# Patient Record
Sex: Female | Born: 1945 | Race: White | Hispanic: No | Marital: Married | Smoking: Never smoker
Health system: Southern US, Community
[De-identification: ages and names within clinical notes are randomized; demographics above are authoritative.]

## PROBLEM LIST (undated history)

## (undated) DIAGNOSIS — D649 Anemia, unspecified: Secondary | ICD-10-CM

## (undated) DIAGNOSIS — R519 Headache, unspecified: Secondary | ICD-10-CM

## (undated) DIAGNOSIS — I10 Essential (primary) hypertension: Secondary | ICD-10-CM

## (undated) DIAGNOSIS — M1990 Unspecified osteoarthritis, unspecified site: Secondary | ICD-10-CM

## (undated) DIAGNOSIS — E785 Hyperlipidemia, unspecified: Secondary | ICD-10-CM

## (undated) DIAGNOSIS — R12 Heartburn: Secondary | ICD-10-CM

## (undated) DIAGNOSIS — Z973 Presence of spectacles and contact lenses: Secondary | ICD-10-CM

## (undated) DIAGNOSIS — K219 Gastro-esophageal reflux disease without esophagitis: Secondary | ICD-10-CM

## (undated) DIAGNOSIS — H547 Unspecified visual loss: Secondary | ICD-10-CM

## (undated) HISTORY — DX: Heartburn: R12

## (undated) HISTORY — DX: Essential (primary) hypertension: I10

## (undated) HISTORY — DX: Unspecified visual loss: H54.7

## (undated) HISTORY — DX: Anemia, unspecified: D64.9

## (undated) HISTORY — PX: HYSTERECTOMY: SHX81

## (undated) HISTORY — DX: Presence of spectacles and contact lenses: Z97.3

## (undated) HISTORY — DX: Headache, unspecified: R51.9

## (undated) HISTORY — PX: APPENDECTOMY (OPEN): SHX54

---

## 1989-11-01 ENCOUNTER — Ambulatory Visit: Admit: 1989-11-01 | Payer: Self-pay | Source: Ambulatory Visit

## 1991-02-17 ENCOUNTER — Ambulatory Visit: Admit: 1991-02-17 | Disposition: A | Discharge status: Home / Self Care | Payer: Self-pay | Source: Ambulatory Visit

## 1992-01-13 ENCOUNTER — Ambulatory Visit: Admit: 1992-01-13 | Disposition: A | Discharge status: Home / Self Care | Payer: Self-pay | Source: Ambulatory Visit

## 1993-01-26 ENCOUNTER — Ambulatory Visit: Admit: 1993-01-26 | Disposition: A | Discharge status: Home / Self Care | Payer: Self-pay | Source: Ambulatory Visit

## 1994-02-09 ENCOUNTER — Ambulatory Visit: Admit: 1994-02-09 | Disposition: A | Discharge status: Home / Self Care | Payer: Self-pay | Source: Ambulatory Visit

## 1994-02-22 ENCOUNTER — Ambulatory Visit: Admit: 1994-02-22 | Disposition: A | Discharge status: Home / Self Care | Payer: Self-pay | Source: Ambulatory Visit

## 1994-03-19 ENCOUNTER — Emergency Department: Admit: 1994-03-19 | Disposition: A | Discharge status: Home / Self Care | Payer: Self-pay | Source: Ambulatory Visit

## 1995-01-09 ENCOUNTER — Ambulatory Visit: Admit: 1995-01-09 | Disposition: A | Discharge status: Home / Self Care | Payer: Self-pay | Source: Ambulatory Visit

## 1995-04-15 ENCOUNTER — Emergency Department: Admit: 1995-04-15 | Disposition: A | Discharge status: Home / Self Care | Payer: Self-pay | Source: Ambulatory Visit

## 1995-04-16 ENCOUNTER — Emergency Department: Admit: 1995-04-16 | Disposition: A | Discharge status: Home / Self Care | Payer: Self-pay | Source: Ambulatory Visit

## 1996-02-12 ENCOUNTER — Ambulatory Visit: Admit: 1996-02-12 | Disposition: A | Discharge status: Home / Self Care | Payer: Self-pay | Source: Ambulatory Visit

## 1996-04-26 ENCOUNTER — Ambulatory Visit: Admit: 1996-04-26 | Disposition: A | Discharge status: Home / Self Care | Payer: Self-pay | Source: Ambulatory Visit

## 1997-04-16 ENCOUNTER — Ambulatory Visit: Admit: 1997-04-16 | Disposition: A | Discharge status: Home / Self Care | Payer: Self-pay | Source: Ambulatory Visit

## 1997-09-01 ENCOUNTER — Emergency Department
Admission: EM | Admit: 1997-09-01 | Disposition: A | Discharge status: Home / Self Care | Payer: Self-pay | Source: Ambulatory Visit

## 1999-02-13 ENCOUNTER — Ambulatory Visit
Admission: RE | Admit: 1999-02-13 | Disposition: A | Discharge status: Home / Self Care | Payer: Self-pay | Source: Ambulatory Visit

## 1999-11-28 ENCOUNTER — Ambulatory Visit
Admission: RE | Admit: 1999-11-28 | Disposition: A | Discharge status: Home / Self Care | Payer: Self-pay | Source: Ambulatory Visit

## 1999-12-15 ENCOUNTER — Ambulatory Visit: Admit: 1999-12-15 | Disposition: A | Discharge status: Home / Self Care | Payer: Self-pay | Source: Ambulatory Visit

## 2000-03-27 ENCOUNTER — Ambulatory Visit
Admission: RE | Admit: 2000-03-27 | Disposition: A | Discharge status: Home / Self Care | Payer: Self-pay | Source: Ambulatory Visit

## 2000-04-02 ENCOUNTER — Ambulatory Visit
Admission: RE | Admit: 2000-04-02 | Disposition: A | Discharge status: Home / Self Care | Payer: Self-pay | Source: Ambulatory Visit

## 2001-03-03 ENCOUNTER — Ambulatory Visit
Admission: RE | Admit: 2001-03-03 | Disposition: A | Discharge status: Home / Self Care | Payer: Self-pay | Source: Ambulatory Visit

## 2001-05-07 ENCOUNTER — Inpatient Hospital Stay
Admission: RE | Admit: 2001-05-07 | Disposition: A | Discharge status: Home / Self Care | Payer: Self-pay | Source: Ambulatory Visit

## 2001-05-24 ENCOUNTER — Emergency Department
Admission: EM | Admit: 2001-05-24 | Disposition: A | Discharge status: Home / Self Care | Payer: Self-pay | Source: Ambulatory Visit

## 2001-08-27 HISTORY — PX: OTHER SURGICAL HISTORY: SHX169

## 2002-08-22 ENCOUNTER — Emergency Department
Admission: EM | Admit: 2002-08-22 | Disposition: A | Discharge status: Home / Self Care | Payer: Self-pay | Source: Ambulatory Visit

## 2002-10-22 ENCOUNTER — Emergency Department
Admission: EM | Admit: 2002-10-22 | Disposition: A | Discharge status: Home / Self Care | Payer: Self-pay | Source: Ambulatory Visit

## 2004-02-04 ENCOUNTER — Ambulatory Visit
Admission: RE | Admit: 2004-02-04 | Disposition: A | Discharge status: Home / Self Care | Payer: Self-pay | Source: Ambulatory Visit

## 2004-03-09 ENCOUNTER — Ambulatory Visit
Admission: RE | Admit: 2004-03-09 | Disposition: A | Discharge status: Home / Self Care | Payer: Self-pay | Source: Ambulatory Visit

## 2004-03-27 ENCOUNTER — Emergency Department
Admission: EM | Admit: 2004-03-27 | Disposition: A | Discharge status: Home / Self Care | Payer: Self-pay | Source: Ambulatory Visit

## 2006-03-22 ENCOUNTER — Ambulatory Visit
Admission: RE | Admit: 2006-03-22 | Disposition: A | Discharge status: Home / Self Care | Payer: Self-pay | Source: Ambulatory Visit

## 2007-03-24 ENCOUNTER — Ambulatory Visit
Admission: RE | Admit: 2007-03-24 | Disposition: A | Discharge status: Home / Self Care | Payer: Self-pay | Source: Ambulatory Visit

## 2008-09-04 ENCOUNTER — Emergency Department
Admission: EM | Admit: 2008-09-04 | Disposition: A | Discharge status: Home / Self Care | Payer: Self-pay | Source: Ambulatory Visit

## 2009-03-30 ENCOUNTER — Ambulatory Visit
Admission: RE | Admit: 2009-03-30 | Disposition: A | Discharge status: Home / Self Care | Payer: Self-pay | Source: Ambulatory Visit

## 2009-05-25 ENCOUNTER — Ambulatory Visit
Admission: RE | Admit: 2009-05-25 | Disposition: A | Discharge status: Home / Self Care | Payer: Self-pay | Source: Ambulatory Visit | Admitting: Orthopaedic Surgery

## 2009-05-25 HISTORY — PX: RELEASE, DEQUERVAIN'S CONTRACTURE: SHX5075

## 2009-08-27 HISTORY — PX: CYST REMOVAL: SHX22

## 2010-03-19 DIAGNOSIS — Z4789 Encounter for other orthopedic aftercare: Secondary | ICD-10-CM | POA: Insufficient documentation

## 2010-08-27 DIAGNOSIS — G5601 Carpal tunnel syndrome, right upper limb: Secondary | ICD-10-CM

## 2010-08-27 HISTORY — DX: Carpal tunnel syndrome, right upper limb: G56.01

## 2010-09-11 ENCOUNTER — Ambulatory Visit
Admission: RE | Admit: 2010-09-11 | Disposition: A | Discharge status: Home / Self Care | Payer: Self-pay | Source: Ambulatory Visit

## 2010-09-13 ENCOUNTER — Ambulatory Visit (INDEPENDENT_AMBULATORY_CARE_PROVIDER_SITE_OTHER)
Admission: RE | Admit: 2010-09-13 | Disposition: A | Discharge status: Home / Self Care | Payer: Self-pay | Source: Ambulatory Visit

## 2010-09-13 ENCOUNTER — Ambulatory Visit
Admission: RE | Admit: 2010-09-13 | Disposition: A | Discharge status: Home / Self Care | Payer: Self-pay | Source: Ambulatory Visit

## 2011-03-07 ENCOUNTER — Ambulatory Visit
Admission: RE | Admit: 2011-03-07 | Disposition: A | Discharge status: Home / Self Care | Payer: Self-pay | Source: Ambulatory Visit | Admitting: Orthopaedic Surgery

## 2011-03-07 HISTORY — PX: CARPAL TUNNEL RELEASE: SHX101

## 2011-03-15 DIAGNOSIS — G5600 Carpal tunnel syndrome, unspecified upper limb: Secondary | ICD-10-CM | POA: Insufficient documentation

## 2011-08-12 ENCOUNTER — Ambulatory Visit (INDEPENDENT_AMBULATORY_CARE_PROVIDER_SITE_OTHER)
Admission: RE | Admit: 2011-08-12 | Disposition: A | Discharge status: Home / Self Care | Payer: Self-pay | Source: Ambulatory Visit

## 2011-09-07 ENCOUNTER — Emergency Department
Admission: EM | Admit: 2011-09-07 | Disposition: A | Discharge status: Home / Self Care | Payer: Self-pay | Source: Ambulatory Visit

## 2011-09-28 DIAGNOSIS — A4151 Sepsis due to Escherichia coli [E. coli]: Secondary | ICD-10-CM

## 2011-09-28 HISTORY — DX: Sepsis due to Escherichia coli (e. coli): A41.51

## 2011-10-15 ENCOUNTER — Inpatient Hospital Stay
Admission: EM | Admit: 2011-10-15 | Disposition: A | Discharge status: Home / Self Care | Payer: Self-pay | Source: Ambulatory Visit | Admitting: Family Medicine

## 2011-11-09 ENCOUNTER — Ambulatory Visit
Admission: RE | Admit: 2011-11-09 | Disposition: A | Discharge status: Home / Self Care | Payer: Self-pay | Source: Ambulatory Visit

## 2011-12-03 ENCOUNTER — Ambulatory Visit
Admission: RE | Admit: 2011-12-03 | Disposition: A | Discharge status: Home / Self Care | Payer: Self-pay | Source: Ambulatory Visit

## 2012-04-02 ENCOUNTER — Ambulatory Visit
Admission: RE | Admit: 2012-04-02 | Disposition: A | Discharge status: Home / Self Care | Payer: Self-pay | Source: Ambulatory Visit

## 2012-07-23 ENCOUNTER — Ambulatory Visit
Admission: RE | Admit: 2012-07-23 | Disposition: A | Discharge status: Home / Self Care | Payer: Self-pay | Source: Ambulatory Visit

## 2012-08-12 ENCOUNTER — Ambulatory Visit
Admission: RE | Admit: 2012-08-12 | Disposition: A | Discharge status: Home / Self Care | Payer: Self-pay | Source: Ambulatory Visit

## 2012-08-27 ENCOUNTER — Ambulatory Visit
Admission: RE | Admit: 2012-08-27 | Disposition: A | Discharge status: Home / Self Care | Payer: Self-pay | Source: Ambulatory Visit

## 2012-08-29 LAB — URINALYSIS, REFLEX TO MICROSCOPIC EXAM IF INDICATED
Bilirubin, UA: NEGATIVE mg/dL
Blood, UA: NEGATIVE mg/dL
Glucose, UA: NEGATIVE mg/dL
Ketones UA: NEGATIVE mg/dL
Nitrite, UA: NEGATIVE
Urine Protein: NEGATIVE mg/dL
Urine Specific Gravity: 1.002 (ref 1.001–1.040)
Urobilinogen, UA: 0.2 mg/dL
pH, Urine: 6.5 pH (ref 5.0–8.0)

## 2012-08-29 LAB — CBC AND DIFFERENTIAL
Basophils %: 0.9 % (ref 0.0–3.0)
Basophils Absolute: 0.1 10*3/uL (ref 0.0–0.3)
Eosinophils %: 1 % (ref 0.0–7.0)
Eosinophils Absolute: 0.1 10*3/uL (ref 0.0–0.8)
Hematocrit: 46.6 % (ref 36.0–48.0)
Hemoglobin: 15.7 gm/dL (ref 12.0–16.0)
Lymphocytes Absolute: 0.8 10*3/uL (ref 0.6–5.1)
Lymphocytes: 13 % — ABNORMAL LOW (ref 15.0–46.0)
MCH: 31 pg (ref 28–35)
MCHC: 34 gm/dL (ref 33–37)
MCV: 92 fL (ref 80–100)
MPV: 7.8 fL — ABNORMAL LOW (ref 8.0–12.0)
Monocytes Absolute: 0.7 10*3/uL (ref 0.1–1.7)
Monocytes: 11.1 % (ref 3.0–15.0)
Neutrophils %: 74 % (ref 42.0–78.0)
Neutrophils Absolute: 4.4 10*3/uL (ref 1.7–8.6)
PLT CT: 173 10*3/uL (ref 130–440)
RBC: 5.08 10*6/uL — ABNORMAL HIGH (ref 3.80–5.00)
RDW: 13.8 % (ref 12.0–15.0)
WBC: 5.9 10*3/uL (ref 4.0–11.0)

## 2012-08-29 LAB — COMPREHENSIVE METABOLIC PANEL
ALT: 19 U/L (ref 0–55)
AST (SGOT): 28 U/L (ref 10–42)
Albumin/Globulin Ratio: 1.15 Ratio (ref 0.70–1.50)
Albumin: 3.9 gm/dL (ref 3.5–5.0)
Alkaline Phosphatase: 67 U/L (ref 40–145)
Anion Gap: 14.8 Gap (ref 7.0–16.0)
BUN / Creatinine Ratio: 11.3 Ratio (ref 10.0–30.0)
BUN: 9 mg/dL (ref 7–22)
Bilirubin, Total: 0.5 mg/dL (ref 0.1–1.2)
CO2: 26 mMol/L (ref 20.0–30.0)
Calcium: 10 mg/dL (ref 8.5–10.5)
Chloride: 97 mMol/L — ABNORMAL LOW (ref 98–110)
Creatinine: 0.8 mg/dL (ref 0.60–1.20)
EGFR: >60 mL/min/{1.73_m2}
Globulin: 3.4 gm/dL (ref 2.0–4.0)
Glucose: 127 mg/dL — ABNORMAL HIGH (ref 70–99)
Osmolality Calc: 270 mOsm/kg — ABNORMAL LOW (ref 275–300)
Potassium: 2.8 mMol/L — CL (ref 3.5–5.3)
Protein, Total: 7.3 gm/dL (ref 6.0–8.3)
Sodium: 135 mMol/L — ABNORMAL LOW (ref 136–147)

## 2012-08-29 LAB — POTASSIUM: Potassium: 3.7 mMol/L (ref 3.5–5.3)

## 2012-09-17 LAB — CBC AND DIFFERENTIAL
Bands: 10 % (ref 0–10)
Bands: 12 % — ABNORMAL HIGH (ref 0–10)
Basophils %: 0.1 % (ref 0.0–3.0)
Basophils %: 0.2 % (ref 0.0–3.0)
Basophils %: 0.3 % (ref 0.0–3.0)
Basophils %: 0.3 % (ref 0.0–3.0)
Basophils %: 0.5 % (ref 0.0–3.0)
Basophils Absolute: 0 10*3/uL (ref 0.0–0.3)
Basophils Absolute: 0 10*3/uL (ref 0.0–0.3)
Basophils Absolute: 0 10*3/uL (ref 0.0–0.3)
Basophils Absolute: 0 10*3/uL (ref 0.0–0.3)
Basophils Absolute: 0 10*3/uL (ref 0.0–0.3)
Basophils Absolute: 0 10*3/uL (ref 0.0–0.3)
Basophils Absolute: 0 10*3/uL (ref 0.0–0.3)
Eosinophils %: 0 % (ref 0.0–7.0)
Eosinophils %: 0 % (ref 0.0–7.0)
Eosinophils %: 1 % (ref 0.0–7.0)
Eosinophils %: 3 % (ref 0.0–7.0)
Eosinophils %: 4.1 % (ref 0.0–7.0)
Eosinophils %: 4.3 % (ref 0.0–7.0)
Eosinophils Absolute: 0 10*3/uL (ref 0.0–0.8)
Eosinophils Absolute: 0 10*3/uL (ref 0.0–0.8)
Eosinophils Absolute: 0 10*3/uL (ref 0.0–0.8)
Eosinophils Absolute: 0.1 10*3/uL (ref 0.0–0.8)
Eosinophils Absolute: 0.2 10*3/uL (ref 0.0–0.8)
Eosinophils Absolute: 0.2 10*3/uL (ref 0.0–0.8)
Eosinophils Absolute: 0.3 10*3/uL (ref 0.0–0.8)
Hematocrit: 26.1 % — ABNORMAL LOW (ref 36.0–48.0)
Hematocrit: 26.6 % — ABNORMAL LOW (ref 36.0–48.0)
Hematocrit: 28.5 % — ABNORMAL LOW (ref 36.0–48.0)
Hematocrit: 29 % — ABNORMAL LOW (ref 36.0–48.0)
Hematocrit: 29.1 % — ABNORMAL LOW (ref 36.0–48.0)
Hematocrit: 29.2 % — ABNORMAL LOW (ref 36.0–48.0)
Hematocrit: 37.4 % (ref 36.0–48.0)
Hemoglobin: 12.8 gm/dL (ref 12.0–16.0)
Hemoglobin: 8.8 gm/dL — ABNORMAL LOW (ref 12.0–16.0)
Hemoglobin: 9 gm/dL — ABNORMAL LOW (ref 12.0–16.0)
Hemoglobin: 9.6 gm/dL — ABNORMAL LOW (ref 12.0–16.0)
Hemoglobin: 9.6 gm/dL — ABNORMAL LOW (ref 12.0–16.0)
Hemoglobin: 9.7 gm/dL — ABNORMAL LOW (ref 12.0–16.0)
Hemoglobin: 9.7 gm/dL — ABNORMAL LOW (ref 12.0–16.0)
Lymphocytes Absolute: 0.4 10*3/uL — ABNORMAL LOW (ref 0.6–5.1)
Lymphocytes Absolute: 0.6 10*3/uL (ref 0.6–5.1)
Lymphocytes Absolute: 0.7 10*3/uL (ref 0.6–5.1)
Lymphocytes Absolute: 0.7 10*3/uL (ref 0.6–5.1)
Lymphocytes Absolute: 0.8 10*3/uL (ref 0.6–5.1)
Lymphocytes Absolute: 0.9 10*3/uL (ref 0.6–5.1)
Lymphocytes Absolute: 0.9 10*3/uL (ref 0.6–5.1)
Lymphocytes: 10 % — ABNORMAL LOW (ref 15.0–46.0)
Lymphocytes: 10 % — ABNORMAL LOW (ref 15.0–46.0)
Lymphocytes: 13.9 % — ABNORMAL LOW (ref 15.0–46.0)
Lymphocytes: 15.8 % (ref 15.0–46.0)
Lymphocytes: 16.8 % (ref 15.0–46.0)
Lymphocytes: 4.7 % — ABNORMAL LOW (ref 15.0–46.0)
Lymphocytes: 4.8 % — ABNORMAL LOW (ref 15.0–46.0)
MCH: 31 pg (ref 28–35)
MCH: 31 pg (ref 28–35)
MCH: 32 pg (ref 28–35)
MCH: 32 pg (ref 28–35)
MCH: 32 pg (ref 28–35)
MCH: 32 pg (ref 28–35)
MCH: 32 pg (ref 28–35)
MCHC: 33 gm/dL (ref 33–37)
MCHC: 33 gm/dL (ref 33–37)
MCHC: 33 gm/dL (ref 33–37)
MCHC: 34 gm/dL (ref 33–37)
MCHC: 34 gm/dL (ref 33–37)
MCHC: 34 gm/dL (ref 33–37)
MCHC: 34 gm/dL (ref 33–37)
MCV: 93 fL (ref 80–100)
MCV: 94 fL (ref 80–100)
MCV: 94 fL (ref 80–100)
MCV: 95 fL (ref 80–100)
MCV: 95 fL (ref 80–100)
MCV: 95 fL (ref 80–100)
MCV: 95 fL (ref 80–100)
MPV: 7.1 fL — ABNORMAL LOW (ref 8.0–12.0)
MPV: 7.3 fL — ABNORMAL LOW (ref 8.0–12.0)
MPV: 7.6 fL — ABNORMAL LOW (ref 8.0–12.0)
MPV: 7.7 fL — ABNORMAL LOW (ref 8.0–12.0)
MPV: 8.2 fL (ref 8.0–12.0)
MPV: 8.2 fL (ref 8.0–12.0)
MPV: 8.3 fL (ref 8.0–12.0)
Monocytes Absolute: 0.5 10*3/uL (ref 0.1–1.7)
Monocytes Absolute: 0.6 10*3/uL (ref 0.1–1.7)
Monocytes Absolute: 0.6 10*3/uL (ref 0.1–1.7)
Monocytes Absolute: 0.6 10*3/uL (ref 0.1–1.7)
Monocytes Absolute: 0.6 10*3/uL (ref 0.1–1.7)
Monocytes Absolute: 0.6 10*3/uL (ref 0.1–1.7)
Monocytes Absolute: 1 10*3/uL (ref 0.1–1.7)
Monocytes: 10.4 % (ref 3.0–15.0)
Monocytes: 11.8 % (ref 3.0–15.0)
Monocytes: 7 % (ref 3.0–15.0)
Monocytes: 7.8 % (ref 3.0–15.0)
Monocytes: 7.9 % (ref 3.0–15.0)
Monocytes: 8 % (ref 3.0–15.0)
Monocytes: 9.9 % (ref 3.0–15.0)
Neutrophils %: 66.8 % (ref 42.0–78.0)
Neutrophils %: 69.7 % (ref 42.0–78.0)
Neutrophils %: 70 % (ref 42.0–78.0)
Neutrophils %: 72 % (ref 42.0–78.0)
Neutrophils %: 72.4 % (ref 42.0–78.0)
Neutrophils %: 87.2 % — ABNORMAL HIGH (ref 42.0–78.0)
Neutrophils %: 87.3 % — ABNORMAL HIGH (ref 42.0–78.0)
Neutrophils Absolute: 11.5 10*3/uL — ABNORMAL HIGH (ref 1.7–8.6)
Neutrophils Absolute: 3.4 10*3/uL (ref 1.7–8.6)
Neutrophils Absolute: 4.1 10*3/uL (ref 1.7–8.6)
Neutrophils Absolute: 4.3 10*3/uL (ref 1.7–8.6)
Neutrophils Absolute: 6 10*3/uL (ref 1.7–8.6)
Neutrophils Absolute: 6.1 10*3/uL (ref 1.7–8.6)
Neutrophils Absolute: 7.2 10*3/uL (ref 1.7–8.6)
PLT CT: 102 10*3/uL — ABNORMAL LOW (ref 130–440)
PLT CT: 113 10*3/uL — ABNORMAL LOW (ref 130–440)
PLT CT: 124 10*3/uL — ABNORMAL LOW (ref 130–440)
PLT CT: 143 10*3/uL (ref 130–440)
PLT CT: 154 10*3/uL (ref 130–440)
PLT CT: 207 10*3/uL (ref 130–440)
PLT CT: 250 10*3/uL (ref 130–440)
RBC: 2.79 10*6/uL — ABNORMAL LOW (ref 3.80–5.00)
RBC: 2.83 10*6/uL — ABNORMAL LOW (ref 3.80–5.00)
RBC: 3.01 10*6/uL — ABNORMAL LOW (ref 3.80–5.00)
RBC: 3.06 10*6/uL — ABNORMAL LOW (ref 3.80–5.00)
RBC: 3.08 10*6/uL — ABNORMAL LOW (ref 3.80–5.00)
RBC: 3.09 10*6/uL — ABNORMAL LOW (ref 3.80–5.00)
RBC: 4.02 10*6/uL (ref 3.80–5.00)
RDW: 14.1 % (ref 12.0–15.0)
RDW: 14.5 % (ref 12.0–15.0)
RDW: 14.9 % (ref 12.0–15.0)
RDW: 15.6 % — ABNORMAL HIGH (ref 12.0–15.0)
RDW: 15.8 % — ABNORMAL HIGH (ref 12.0–15.0)
RDW: 15.9 % — ABNORMAL HIGH (ref 12.0–15.0)
RDW: 16.2 % — ABNORMAL HIGH (ref 12.0–15.0)
WBC: 13.2 10*3/uL — ABNORMAL HIGH (ref 4.0–11.0)
WBC: 5.1 10*3/uL (ref 4.0–11.0)
WBC: 5.9 10*3/uL (ref 4.0–11.0)
WBC: 6 10*3/uL (ref 4.0–11.0)
WBC: 7.3 10*3/uL (ref 4.0–11.0)
WBC: 7.4 10*3/uL (ref 4.0–11.0)
WBC: 8.2 10*3/uL (ref 4.0–11.0)

## 2012-09-17 LAB — PHOSPHORUS
Phosphorus: 0.8 mg/dL — CL (ref 2.3–4.7)
Phosphorus: 1.4 mg/dL — ABNORMAL LOW (ref 2.3–4.7)
Phosphorus: 1.5 mg/dL — ABNORMAL LOW (ref 2.3–4.7)
Phosphorus: 2.2 mg/dL — ABNORMAL LOW (ref 2.3–4.7)
Phosphorus: 2.4 mg/dL (ref 2.3–4.7)
Phosphorus: 2.7 mg/dL (ref 2.3–4.7)
Phosphorus: 2.8 mg/dL (ref 2.3–4.7)

## 2012-09-17 LAB — BASIC METABOLIC PANEL
Anion Gap: 10.3 Gap (ref 7.0–16.0)
Anion Gap: 11 Gap (ref 7.0–16.0)
Anion Gap: 11.9 Gap (ref 7.0–16.0)
Anion Gap: 12.2 Gap (ref 7.0–16.0)
Anion Gap: 9.2 Gap (ref 7.0–16.0)
BUN / Creatinine Ratio: 12.1 Ratio (ref 10.0–30.0)
BUN / Creatinine Ratio: 15.4 Ratio (ref 10.0–30.0)
BUN / Creatinine Ratio: 4.8 Ratio — ABNORMAL LOW (ref 10.0–30.0)
BUN / Creatinine Ratio: 6.5 Ratio — ABNORMAL LOW (ref 10.0–30.0)
BUN / Creatinine Ratio: 9.5 Ratio — ABNORMAL LOW (ref 10.0–30.0)
BUN: 10 mg/dL (ref 7–22)
BUN: 3 mg/dL — ABNORMAL LOW (ref 7–22)
BUN: 4 mg/dL — ABNORMAL LOW (ref 7–22)
BUN: 6 mg/dL — ABNORMAL LOW (ref 7–22)
BUN: 8 mg/dL (ref 7–22)
CO2: 18 mMol/L — ABNORMAL LOW (ref 20.0–30.0)
CO2: 19 mMol/L — ABNORMAL LOW (ref 20.0–30.0)
CO2: 21 mMol/L (ref 20.0–30.0)
CO2: 22 mMol/L (ref 20.0–30.0)
CO2: 27 mMol/L (ref 20.0–30.0)
Calcium: 7.5 mg/dL — ABNORMAL LOW (ref 8.5–10.5)
Calcium: 7.6 mg/dL — ABNORMAL LOW (ref 8.5–10.5)
Calcium: 8 mg/dL — ABNORMAL LOW (ref 8.5–10.5)
Calcium: 8.2 mg/dL — ABNORMAL LOW (ref 8.5–10.5)
Calcium: 8.8 mg/dL (ref 8.5–10.5)
Chloride: 108 mMol/L (ref 98–110)
Chloride: 111 mMol/L — ABNORMAL HIGH (ref 98–110)
Chloride: 111 mMol/L — ABNORMAL HIGH (ref 98–110)
Chloride: 114 mMol/L — ABNORMAL HIGH (ref 98–110)
Chloride: 115 mMol/L — ABNORMAL HIGH (ref 98–110)
Creatinine: 0.62 mg/dL (ref 0.60–1.20)
Creatinine: 0.62 mg/dL (ref 0.60–1.20)
Creatinine: 0.63 mg/dL (ref 0.60–1.20)
Creatinine: 0.65 mg/dL (ref 0.60–1.20)
Creatinine: 0.66 mg/dL (ref 0.60–1.20)
EGFR: >60 mL/min/{1.73_m2}
EGFR: >60 mL/min/{1.73_m2}
EGFR: >60 mL/min/{1.73_m2}
EGFR: >60 mL/min/{1.73_m2}
EGFR: >60 mL/min/{1.73_m2}
Glucose: 108 mg/dL — ABNORMAL HIGH (ref 70–99)
Glucose: 87 mg/dL (ref 70–99)
Glucose: 89 mg/dL (ref 70–99)
Glucose: 89 mg/dL (ref 70–99)
Glucose: 94 mg/dL (ref 70–99)
Osmolality Calc: 276 mOsm/kg (ref 275–300)
Osmolality Calc: 276 mOsm/kg (ref 275–300)
Osmolality Calc: 278 mOsm/kg (ref 275–300)
Osmolality Calc: 279 mOsm/kg (ref 275–300)
Osmolality Calc: 281 mOsm/kg (ref 275–300)
Potassium: 3 mMol/L — CL (ref 3.5–5.3)
Potassium: 3.2 mMol/L — ABNORMAL LOW (ref 3.5–5.3)
Potassium: 3.2 mMol/L — ABNORMAL LOW (ref 3.5–5.3)
Potassium: 3.3 mMol/L — ABNORMAL LOW (ref 3.5–5.3)
Potassium: 4.9 mMol/L (ref 3.5–5.3)
Sodium: 139 mMol/L (ref 136–147)
Sodium: 140 mMol/L (ref 136–147)
Sodium: 141 mMol/L (ref 136–147)
Sodium: 141 mMol/L (ref 136–147)
Sodium: 142 mMol/L (ref 136–147)

## 2012-09-17 LAB — MAGNESIUM
Magnesium: 1.4 mg/dL — ABNORMAL LOW (ref 1.6–2.6)
Magnesium: 1.5 mg/dL — ABNORMAL LOW (ref 1.6–2.6)
Magnesium: 1.5 mg/dL — ABNORMAL LOW (ref 1.6–2.6)
Magnesium: 1.5 mg/dL — ABNORMAL LOW (ref 1.6–2.6)
Magnesium: 1.6 mg/dL (ref 1.6–2.6)
Magnesium: 1.6 mg/dL (ref 1.6–2.6)
Magnesium: 2.1 mg/dL (ref 1.6–2.6)

## 2012-09-17 LAB — COMPREHENSIVE METABOLIC PANEL
ALT: 15 U/L (ref 0–55)
ALT: 22 U/L (ref 0–55)
AST (SGOT): 17 U/L (ref 10–42)
AST (SGOT): 26 U/L (ref 10–42)
Albumin/Globulin Ratio: 0.77 Ratio (ref 0.70–1.50)
Albumin/Globulin Ratio: 0.82 Ratio (ref 0.70–1.50)
Albumin: 2 gm/dL — ABNORMAL LOW (ref 3.5–5.0)
Albumin: 3.1 gm/dL — ABNORMAL LOW (ref 3.5–5.0)
Alkaline Phosphatase: 51 U/L (ref 40–145)
Alkaline Phosphatase: 80 U/L (ref 40–145)
Anion Gap: 10 Gap (ref 7.0–16.0)
Anion Gap: 16.8 Gap — ABNORMAL HIGH (ref 7.0–16.0)
BUN / Creatinine Ratio: 15.8 Ratio (ref 10.0–30.0)
BUN / Creatinine Ratio: 18.3 Ratio (ref 10.0–30.0)
BUN: 12 mg/dL (ref 7–22)
BUN: 17 mg/dL (ref 7–22)
Bilirubin, Total: 1.4 mg/dL — ABNORMAL HIGH (ref 0.1–1.2)
Bilirubin, Total: 2.3 mg/dL — ABNORMAL HIGH (ref 0.1–1.2)
CO2: 22 mMol/L (ref 20.0–30.0)
CO2: 24 mMol/L (ref 20.0–30.0)
Calcium: 7.7 mg/dL — ABNORMAL LOW (ref 8.5–10.5)
Calcium: 9.6 mg/dL (ref 8.5–10.5)
Chloride: 106 mMol/L (ref 98–110)
Chloride: 89 mMol/L — ABNORMAL LOW (ref 98–110)
Creatinine: 0.76 mg/dL (ref 0.60–1.20)
Creatinine: 0.93 mg/dL (ref 0.60–1.20)
EGFR: >60 mL/min/{1.73_m2}
EGFR: >60 mL/min/{1.73_m2}
Globulin: 2.6 gm/dL (ref 2.0–4.0)
Globulin: 3.8 gm/dL (ref 2.0–4.0)
Glucose: 135 mg/dL — ABNORMAL HIGH (ref 70–99)
Glucose: 165 mg/dL — ABNORMAL HIGH (ref 70–99)
Osmolality Calc: 259 mOsm/kg — ABNORMAL LOW (ref 275–300)
Osmolality Calc: 274 mOsm/kg — ABNORMAL LOW (ref 275–300)
Potassium: 2.8 mMol/L — CL (ref 3.5–5.3)
Potassium: 3 mMol/L — CL (ref 3.5–5.3)
Protein, Total: 4.6 gm/dL — ABNORMAL LOW (ref 6.0–8.3)
Protein, Total: 6.9 gm/dL (ref 6.0–8.3)
Sodium: 127 mMol/L — ABNORMAL LOW (ref 136–147)
Sodium: 135 mMol/L — ABNORMAL LOW (ref 136–147)

## 2012-09-17 LAB — VANCOMYCIN, TROUGH: Vancomycin Trough: 4.31 ug/mL — ABNORMAL LOW (ref 10.00–20.00)

## 2012-09-17 LAB — PT/INR
PT INR: 1 (ref 0.8–1.2)
PT INR: 1 (ref 0.8–1.2)
PT INR: 1 (ref 0.8–1.2)
PT INR: 1.1 (ref 0.8–1.2)
PT INR: 1.1 (ref 0.8–1.2)
PT INR: 1.1 (ref 0.8–1.2)
PT: 10.5 s (ref 9.4–12.5)
PT: 10.7 s (ref 9.4–12.5)
PT: 10.7 s (ref 9.4–12.5)
PT: 11.4 s (ref 9.4–12.5)
PT: 11.8 s (ref 9.4–12.5)
PT: 12 s (ref 9.4–12.5)

## 2012-09-17 LAB — URINALYSIS, REFLEX TO MICROSCOPIC EXAM IF INDICATED
Glucose, UA: NEGATIVE mg/dL
Nitrite, UA: NEGATIVE
Urine Specific Gravity: 1.015 (ref 1.001–1.040)
Urobilinogen, UA: 1 mg/dL — AB
pH, Urine: 6.5 pH (ref 5.0–8.0)

## 2012-09-17 LAB — APTT
aPTT: 35.2 s — ABNORMAL HIGH (ref 22.0–35.0)
aPTT: 41.5 s — ABNORMAL HIGH (ref 22.0–35.0)

## 2012-09-17 LAB — AMYLASE: Amylase: 29 U/L — ABNORMAL LOW (ref 30–135)

## 2012-09-17 LAB — LACTIC ACID, PLASMA
Lactic Acid: 0.81 mMol/L (ref 0.50–2.10)
Lactic Acid: 1.16 mMol/L (ref 0.50–2.10)
Lactic Acid: 1.35 mMol/L (ref 0.50–2.10)
Lactic Acid: 2.6 mMol/L (ref 0.50–2.10)
Lactic Acid: 2.99 mMol/L (ref 0.50–2.10)

## 2012-09-17 LAB — POTASSIUM: Potassium: 3.3 mMol/L — ABNORMAL LOW (ref 3.5–5.3)

## 2012-09-17 LAB — VH C. DIFFICILE TOXIN B GENE BY DNA AMPLIFICATION: Stool Clostridium difficile Toxin B Gene DNA Amplification: NEGATIVE

## 2012-09-17 LAB — CARDIAC PROF.WITH TROPONIN HLAB
CKMB Index: 1 % (ref 0.0–2.3)
Creatine Kinase (CK): 112 U/L (ref 30–189)
Creatinine Kinase MB (CKMB): 1.1 ng/mL (ref 0.1–6.0)
Troponin I: 0.01 ng/mL (ref 0.00–0.02)

## 2012-09-17 LAB — LIPASE: Lipase: 11 U/L (ref 8–78)

## 2012-09-27 ENCOUNTER — Ambulatory Visit
Admission: RE | Admit: 2012-09-27 | Disposition: A | Discharge status: Home / Self Care | Payer: Self-pay | Source: Ambulatory Visit

## 2012-10-13 ENCOUNTER — Ambulatory Visit
Admission: RE | Admit: 2012-10-13 | Disposition: A | Discharge status: Home / Self Care | Payer: Self-pay | Source: Ambulatory Visit

## 2012-10-24 DIAGNOSIS — M1710 Unilateral primary osteoarthritis, unspecified knee: Secondary | ICD-10-CM | POA: Insufficient documentation

## 2012-11-03 ENCOUNTER — Ambulatory Visit
Admission: RE | Admit: 2012-11-03 | Disposition: A | Discharge status: Home / Self Care | Payer: Self-pay | Source: Ambulatory Visit | Admitting: Orthopaedic Surgery

## 2012-11-03 HISTORY — PX: KNEE ARTHROSCOPY: SUR90

## 2012-11-03 LAB — COMPREHENSIVE METABOLIC PANEL
ALT: 24 U/L (ref 0–55)
AST (SGOT): 23 U/L (ref 10–42)
Albumin/Globulin Ratio: 1.27 Ratio (ref 0.70–1.50)
Albumin: 3.8 gm/dL (ref 3.5–5.0)
Alkaline Phosphatase: 77 U/L (ref 40–145)
Anion Gap: 15.3 mMol/L (ref 7.0–18.0)
BUN / Creatinine Ratio: 18.9 Ratio (ref 10.0–30.0)
BUN: 17 mg/dL (ref 7–22)
Bilirubin, Total: 0.5 mg/dL (ref 0.1–1.2)
CO2: 27 mMol/L (ref 20.0–30.0)
Calcium: 9.8 mg/dL (ref 8.5–10.5)
Chloride: 103 mMol/L (ref 98–110)
Creatinine: 0.9 mg/dL (ref 0.60–1.20)
EGFR: >60 mL/min/{1.73_m2}
Globulin: 3 gm/dL (ref 2.0–4.0)
Glucose: 140 mg/dL — ABNORMAL HIGH (ref 70–99)
Osmolality Calc: 287 mOsm/kg (ref 275–300)
Potassium: 3.3 mMol/L — ABNORMAL LOW (ref 3.5–5.3)
Protein, Total: 6.8 gm/dL (ref 6.0–8.3)
Sodium: 142 mMol/L (ref 136–147)

## 2012-11-03 LAB — CBC AND DIFFERENTIAL
Basophils %: 0.6 % (ref 0.0–3.0)
Basophils Absolute: 0 10*3/uL (ref 0.0–0.3)
Eosinophils %: 3.6 % (ref 0.0–7.0)
Eosinophils Absolute: 0.2 10*3/uL (ref 0.0–0.8)
Hematocrit: 41.4 % (ref 36.0–48.0)
Hemoglobin: 13.9 gm/dL (ref 12.0–16.0)
Lymphocytes Absolute: 1.5 10*3/uL (ref 0.6–5.1)
Lymphocytes: 25.9 % (ref 15.0–46.0)
MCH: 32 pg (ref 28–35)
MCHC: 34 gm/dL (ref 33–37)
MCV: 94 fL (ref 80–100)
MPV: 8.6 fL (ref 8.0–12.0)
Monocytes Absolute: 0.4 10*3/uL (ref 0.1–1.7)
Monocytes: 6.8 % (ref 3.0–15.0)
Neutrophils %: 63.1 % (ref 42.0–78.0)
Neutrophils Absolute: 3.6 10*3/uL (ref 1.7–8.6)
PLT CT: 227 10*3/uL (ref 130–440)
RBC: 4.4 10*6/uL (ref 3.80–5.00)
RDW: 13.4 % (ref 12.0–15.0)
WBC: 5.7 10*3/uL (ref 4.0–11.0)

## 2012-11-03 LAB — MAGNESIUM: Magnesium: 1.7 mg/dL (ref 1.6–2.6)

## 2012-11-03 LAB — APTT: aPTT: 26.8 s (ref 22.0–35.0)

## 2012-11-03 LAB — TROPONIN I: Troponin I: 0 ng/mL (ref 0.00–0.02)

## 2012-11-03 LAB — PT/INR
PT INR: 1 (ref 0.8–1.2)
PT: 10.7 s (ref 9.4–12.5)

## 2012-11-14 ENCOUNTER — Encounter (INDEPENDENT_AMBULATORY_CARE_PROVIDER_SITE_OTHER): Payer: Self-pay | Admitting: Orthopaedic Surgery

## 2012-11-14 DIAGNOSIS — G5601 Carpal tunnel syndrome, right upper limb: Secondary | ICD-10-CM | POA: Insufficient documentation

## 2012-11-14 LAB — BASIC METABOLIC PANEL
Anion Gap: 13.9 mMol/L (ref 7.0–18.0)
BUN / Creatinine Ratio: 16.5 Ratio (ref 10.0–30.0)
BUN: 15 mg/dL (ref 7–22)
CO2: 26 mMol/L (ref 20.0–30.0)
Calcium: 9.5 mg/dL (ref 8.5–10.5)
Chloride: 105 mMol/L (ref 98–110)
Creatinine: 0.91 mg/dL (ref 0.60–1.20)
EGFR: >60 mL/min/{1.73_m2}
Glucose: 94 mg/dL (ref 70–99)
Osmolality Calc: 282 mOsm/kg (ref 275–300)
Potassium: 3.9 mMol/L (ref 3.5–5.3)
Sodium: 141 mMol/L (ref 136–147)

## 2012-11-21 NOTE — Op Note (Addendum)
[facility]                           OPERATIVE/PROCEDURE REPORT              NAME: [name] [name] ANN                  ADMITTED:   11/03/2012       MR#:  [id number]                            ACCT#:      [id number]       DOB:  1946/04/29                             PT TYPE/ROOM: S/--IO              _________________________________________________________________       DATE OF PROCEDURE:       11/03/2012              INDICATIONS:                     PREOPERATIVE DIAGNOSIS:       Torn medial meniscus and degenerative joint disease, left knee.              POSTOPERATIVE DIAGNOSIS:       Torn medial meniscus and degenerative joint disease, left knee       plus torn lateral meniscus.              PROCEDURE:       Diagnostic and surgical arthroscopy with partial medial and       lateral meniscectomies with medial compartment chondroplasty.              SURGEON:       Alison Murray, MD.              ASSISTANT(S):                     ANESTHESIA:       General.              ESTIMATED BLOOD LOSS:       Minimal.              TOURNIQUET TIME:       28 minutes.              PATHOLOGY SPECIMENS:       No specimen was sent.              FINDINGS:                     DESCRIPTION OF PROCEDURE:       The patient was placed in the supine position on the operating       room table.  The left lower limb was prepped and draped in       sterile fashion.  A time-out was held to confirm the patient's       identity and the identity of the procedure.  The Veress needle       was inserted in the lateral suprapatellar pouch.  The       arthroscope was introduced through a lateral portal anteriorly.       It was introduced obliquely so the medial compartment could be  seen first.  There were significant degenerative changes here.       There was hypertrophic synovitis.  A separate puncture was made       on the medial side of the joint and the Stryker tool was       introduced here with the curved double  bite instrument.  We did       a partial synovectomy improving visibility dramatically and we              also did a small conservative chondroplasty at 1st over the       femoral condyle.  Then, we could see the meniscus the anterior       2/3 were intact and the posterior 3rd there was an obvious tear       with a long diagonal flap.  This was trimmed back using the same       tool, then the probe hook was used to probe the rest of the       meniscus to make sure there were no other unstable sections.  We       then did a more proper chondroplasty over the medial femoral       condyle making it as smooth as possible.  Arthritic changes here       were grade 3.  There were no complete bare areas seen and we       were careful to do this in a conservative way to not remove       articular cartilage that might still be useful.  The limb was       placed in the figure of four position, we could see the entire       lateral meniscus.  There was a small parrot-beak shaped tear in       the middle 3rd.  This was trimmed with the same technique that       we had used medially.  Arthritis was much more benign on this       side of the joint with a smooth femur and a fairly good tibia.       No intervention was done in the articular cartilage on this side       of the joint.  We could see the anterior cruciate ligament from       this position and it was intact.  The patellofemoral area was       examined next.  There was hypertrophic synovitis.  There were       degenerative changes in the patella mostly grade 2.  We did not       do a chondroplasty here.  A small amount of inflamed looking       synovium was removed but this was by no means a complete       synovectomy.  It was limited in scope.  The knee was lavaged       clear and the instruments withdrawn.  Steri tapes were used to       close the three punctures.  She was placed in a sterile dressing       with a gently stretched Ace compression wrap.  She was  brought       to recovery in good condition.                                            **  PRELIMINARY REPORT UNLESS SIGNED**                                SEE DOCUMENT IMAGING SYSTEM FOR FINAL REPORT                                                        ________________________________                                        Berniece Pap, MD                                                                                                                  ID:   [id number]                                                          DocType:   [id number]       \:   SR                                                                 /:   [id number]       DD:  11/03/2012 08:33:04                                                DT:  11/03/2012 10:44:20                                                JOB: [id number]                                                            CC:  Dianah Field, MD [phone])            [facility] [phone])                   >  Authenticated by Berniece Pap., MD On 11/17/2012 10:53:49 AM

## 2012-11-21 NOTE — H&P (Signed)
[facility]                             HISTORY AND PHYSICAL         NAME: [name], [name]                  ADMITTED:   11/03/2012       MR#:  [id number]                            ACCT#:      [id number]       DOB:  Dec 26, 1945                             PT TYPE/ROOM: S/--IO         _________________________________________________________________       MEDICAL DOCTOR:       Dianah Field, MD         CHIEF COMPLAINT:       Left knee pain.         HISTORY OF PRESENT ILLNESS:       This is a 67 year old white female who complains of medial and       posterior left knee pain after twisting it at work while mopping       a floor.  Her employer has a rule of about 2-hour reporting of       _injury_____ the worker come and this has resulted in some degree of       conflict since this will seems arbitrary and inappropriate.  The       event happened in November of 2013.  She has had physical       therapy, but has not responded.         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         MEDICATIONS:       Her current medicine list is,         1.  Inderal 160 mg p.o. daily in the morning and a           sustained release formula.       2.  Triamterene/hydrochlorothiazide 37.5/25 mg.       3.  Zocor 40 mg p.o. daily in the evening.       4.  Amitriptyline HCL 50 mg p.o. daily in the evening.         PAST SURGICAL HISTORY:       Includes a left de Quervain's release and a cystectomy in       September of 2010 and a right carpal tunnel release in July of       2012.  She was hospitalized once for sepsis relating to E. coli       in February of 2013.         REVIEW OF SYSTEMS:       Includes a tendency to frequent headaches.  She has been told       she is anemic.  She is prone to an acid stomach.         FAMILY HISTORY:       Indicates that her mother had a myocardial infarction and       diabetes in her father and one sibling also had  myocardial       infarction.          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  She is a nonsmoker.  She does       not consume alcohol.         PHYSICAL EXAMINATION:       GENERAL:  This is a 67 year old white female in no acute       distress.       HEAD, EYES, EARS, NOSE, and THROAT:  Within normal limits.         NECK and AXILLAE:  Free of masses.       LUNGS:  Clear to auscultation.       HEART:  Regular rhythm.  There were no gallops or murmurs.       ABDOMEN:  Nontender.  The bowel sounds are normal.       PERIPHERAL VASCULAR:  Reveals good pulses in all four limbs.       NEUROLOGIC:  Normal for sensory and motor function.       ORTHOPEDIC:  Reveals tenderness over the medial joint line.       There is a very mild effusion.  The range of motion is almost       full.  Internal rotational stress does exacerbate her medial       joint line pain and external rotational stress gives meaningful       relief.  The ligament stress tests do not reveal any       instabilities.  The MRI from 10/13/2012 does reveal a complex       tear of the medial meniscus and some mild degenerative joint       disease.         DIAGNOSIS:       The diagnosis pertaining to the left knee is torn medial       meniscus with mild degenerative joint disease.         PLAN:       The plan is for a diagnostic and surgical arthroscopy with       partial medial meniscectomy.  Discussion was held concerning the       pros, cons and risks of the procedure.  The final decision to       proceed with this intervention was made at the time of this       evaluation.                                  **PRELIMINARY REPORT UNLESS SIGNED**                          SEE DOCUMENT IMAGING SYSTEM FOR FINAL REPORT         I hereby certify this patient for hospitalization based upon       medical necessity as noted above.                                            ________________________________  Berniece Pap, MD               ID:   [id number]       DocType:   [id number]       \:   SR       /:   [id number]       DD:  10/23/2012 11:02:54       DT:  10/23/2012 12:15:00       JOB: [id number]       CC:  Paulo Fruit MD [phone])            [facility] [phone])         >  Authenticated and Edited by Berniece Pap., MD On 11/17/12 10:54:06 AM

## 2012-12-25 ENCOUNTER — Ambulatory Visit (INDEPENDENT_AMBULATORY_CARE_PROVIDER_SITE_OTHER): Payer: Self-pay | Admitting: Orthopaedic Surgery

## 2012-12-25 ENCOUNTER — Encounter (INDEPENDENT_AMBULATORY_CARE_PROVIDER_SITE_OTHER): Payer: Self-pay | Admitting: Orthopaedic Surgery

## 2012-12-25 VITALS — BP 118/87 | HR 85 | Ht 64.0 in | Wt 171.3 lb

## 2012-12-25 NOTE — Progress Notes (Signed)
Progress Note    Subjective:    [name] is a 67 y.o. female being seen for Knee Pain  hurts on stairs, and sometimes at night    HPI:  Reviewed HPI taken by staff.    Review of Systems  :  Reviewed HPI taken by staff.    Objective:    BP 118/87  Pulse 85  Ht 1.626 m (5\' 4" )  Wt 77.701 kg (171 lb 4.8 oz)  BMI 29.39 kg/m2    Physical Exam      rom 0-120  No effussion     Assessment:      1. S/P arthroscopy of left knee, for torn meniscus combined with DJD          Plan:    RTW 12-26-12, avoiding stairs for the first 6 weeks.      Follow up in: prn

## 2012-12-25 NOTE — Progress Notes (Signed)
HPI Comments: Patient presents status post left knee arthroscopy, done on 11/03/12.  Patient states she is much better with the pain level at a 2 on a scale of 1-10 but at night the pain level is at 5-6, it wakes her up at night.      Knee Pain       Review of Systems   Constitutional: Positive for activity change.        Increasing activity slowly status post left knee arthroscopy   HENT: Negative for hearing loss.    Eyes: Negative for visual disturbance.   Respiratory: Negative for shortness of breath.    Cardiovascular: Negative for chest pain, palpitations and leg swelling.   Gastrointestinal: Negative for abdominal pain and diarrhea.   Genitourinary: Negative for urgency.   Musculoskeletal: Positive for joint swelling and gait problem.        Gait problem due to left knee; legs swell especially if on them a lot   Neurological: Negative for seizures, syncope and headaches.   Psychiatric/Behavioral: Negative for dysphoric mood. The patient is not nervous/anxious.

## 2013-01-16 NOTE — H&P (Signed)
[facility]                             HISTORY AND PHYSICAL              NAME: [name] [name] ANN                  ADMITTED:   05/25/2009       MR#:  [id number]                            ACCT#:      [id number]       DOB:  08/11/1946                             PT TYPE/ROOM: S/--IO              _________________________________________________________________       CHIEF COMPLAINT:       Bilateral wrist pain.              HISTORY OF PRESENT ILLNESS:       This is a 67 year old white female, who complains of left wrist       pain over the radial styloid, but also right hand numbness.  She       states a vacuum struck her on the left radial styloid in July of       this year and has had trouble there ever since.  She has also       had some Naprosyn, but not made much progress.  The numbness in       the right hand has been chronic over several years, and it has       improved with physical therapy to a meaningful degree.              ALLERGIES:       Significant for an allergic response to CODEINE.  She reacts       with diffuse itching.              CURRENT MEDICATIONS:       1.  Inderal 80 milligrams p.o. b.i.d.       2.  Zocor 40 milligrams p.o. daily.       3.  Triamterene hydrochlorothiazide 37.5/25 milligrams p.o.           daily.       4.  A multivitamin.       5.  Probiotic acidophilus for digestion.       6.  Omega-3 fish oil 1000 milligrams p.o. daily.              PAST SURGICAL EXPERIENCES:       Include a hysterectomy and a bladder suspension in 2003.              REVIEW OF SYSTEMS:       Reveals a tendency to headache.  She also does have       hypertension.  She has no symptoms, however, relating to the       cardiac, respiratory, GI, or GU systems.              FAMILY HISTORY:       Indicates that both parents have had heart attacks.  SOCIAL HISTORY:       Negative for smoking.  She does not consume alcohol.  She lives       with her husband.               PHYSICAL EXAMINATION:       GENERAL:  This is a 67 year old white female in no acute       distress. HEAD, EYES, EARS, NOSE, AND THROAT:  Within normal       limits.       NECK AND AXILLAE:  Free of masses. LUNGS:  Clear to       auscultation. HEART:  Has a regular rhythm.  There are no       gallops or murmurs. LUNGS:  Clear to auscultation. ABDOMEN:       Nontender.  The bowel sounds are normal.  There are no palpable       masses.       PERIPHERAL VASCULAR:  Exam reveals normal pulses in all 4 limbs.       NEUROLOGIC:  Evaluation is normal for sensory and motor              function, including the right hand when she is at rest. Phalen's       sign is positive on the right, but Tinel's sign is negative.       Neck extension combined with rotation does exacerbate the right       hand numbness in the small, ring, and long fingers.  The       examination on the left side reveals severe tenderness over the       radial styloid.  Finkelstein's test is dramatically positive on       the left.              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              DIAGNOSES:       1.  Pertaining to her left wrist are de Quervain syndrome           and a small dorsal ganglion cyst palpable near the proximal           carpal row.       2.  Pertaining to the right wrist is mild carpal tunnel           syndrome exacerbated by cervical radiculopathy.              TREATMENT PLAN:       For today's purposes, does include a left de Quervain's release       with removal of the ganglion cyst over the left dorsal wrist.       This is planned for 05/25/2009.  Discussion was held concerning       the pro's and con's, as well as the risks of the procedure, and       the final decision to proceed with this planned intervention was       made at the time of this evaluation.                            **PRELIMINARY REPORT UNLESS SIGNED**        SEE DOCUMENT IMAGING SYSTEM FOR FINAL REPORT  ________________________________                              Berniece Pap, MD                                            ID:   [id number]       DocType:   [id number]       \:   CP       /:   [id number]       DD:  04/28/2009 17:33:06       DT:  04/28/2009 19:22:55       JOB: [id number]       CC:  Berneice Heinrich MD ([phone])       >  Authenticated by Berniece Pap., MD On 06/01/2009 07:23:27 AM

## 2013-01-16 NOTE — Op Note (Signed)
[facility]                           OPERATIVE/PROCEDURE REPORT              NAME: [name], [name]                  ADMITTED:   05/25/2009       MR#:  [id number]                            ACCT#:      [id number]       DOB:  06/01/1946                             PT TYPE/ROOM: S/--IO              _________________________________________________________________       POSTOPERATIVE DIAGNOSIS:       Left de Quervain syndrome, and ganglion cyst, left dorsal wrist.              POSTOPERATIVE DIAGNOSIS:       Left de Quervain syndrome, and ganglion cyst, left dorsal wrist.              PROCEDURE:       Left deQuervain's release with cystectomy, left dorsal wrist.              SURGEON:       Alison Murray, MD              ANESTHESIA:       Bier block.              ESTIMATED BLOOD LOSS:       Zero.              PROCEDURE IN DETAIL:       The patient was placed in the supine position on the operating       table.  After the induction of regional anesthesia, the left       upper limb was prepped and draped in sterile fashion.  A 1.5 cm       long transverse incision was made 1 cm proximal to the tip of       the radial styloid.  The skin and subcutaneous tissue was parted       bluntly in the longitudinal orientation.  The 1st dorsal       compartment was gently teased open slowly with a 15 blade, and       then this was extended proximally and distally.  A 2 compartment       1st dorsal compartment was identified and therefore, a separate       slit was made over the 2nd portion of the compartment exposing a       total of 3 tendons in 2 compartments.  The floor of the channel       was explored to make sure that it was the affirmed surface of       the radius.  The thumb range of motion was carried out to make       sure that the tendon excursion was unimpaired.  Once this was       completed, the very small sessile cyst was palpated over the       dorsal wrist.  A transverse incision  was made over here and the       retinaculum was gently teased apart, exposing the extensor       pollicis longus, and we also identified the extensor carpi       radialis longus tendon.  The cyst seemed to be a small sessile       area of hypertrophic synovium coming directly off the wrist       joint.  This was removed.  It was smaller than expected and no       further palpable mass could be identified beyond that.  Each of       the 2 wounds were closed with 2 vertical mattress sutures of 4-0       nylon.  Sterile dressings were applied with a gently stretched       Ace compression wrap.  The patient was brought to recovery in       good condition.                            **PRELIMINARY REPORT UNLESS SIGNED**       SEE DOCUMENT IMAGING SYSTEM FOR FINAL REPORT                                        ________________________________                              Berniece Pap, MD                                            ID:   [id number]       DocType:   [id number]       \:   KB       /:   [id number]       DD:  05/25/2009 08:47:37       DT:  05/25/2009 09:02:14       JOB: [id number]            DR. Liliane Shi [phone])            [facility] [phone])       >  Authenticated by Berniece Pap., MD On 05/25/2009 09:06:02 AM

## 2013-01-17 NOTE — Op Note (Signed)
[facility]                           OPERATIVE/PROCEDURE REPORT              NAME: [name], [name]                  ADMITTED:   03/07/2011       MR#:  [id number]                            ACCT#:      [id number]       DOB:  07-10-1946                             PT TYPE/ROOM: S/--IO              _________________________________________________________________       PREOPERATIVE DIAGNOSIS:       Right carpal tunnel syndrome.              POSTOPERATIVE DIAGNOSIS:       Right carpal tunnel syndrome.              PROCEDURE:       Right carpal tunnel release.              SURGEON:       Alison Murray, MD.              ANESTHESIA:       Bier block.              ESTIMATED BLOOD LOSS:       Zero.              PROCEDURE IN DETAIL:       The patient was placed in the supine position on the operating       room table.  Regional anesthesia was induced.  The right upper       limb was prepped and draped in sterile fashion.  A 3 cm long       incision was made in the proximal palmar wrist crease.  The skin       and subcutaneous tissue was parted.  Care was taken to allow the       subcutaneous dissection to deliberately dressed ulnarward.  The       palmar fascia was identified and it was divided well beyond the       confines of the skin incision into the palm and into the distal       forearm.  Then a thin fatty layer between this layer and the       transverse carpal ligament was opened and then the transverse       carpal ligament was teased open until the synovium of the carpal       canal was identified.  A curved hemostat was placed within the       carpal canal.  A 15 blade was engaged into the serrations in the       upside down position, so the sharp side was facing up.  This was       used to slit the transverse carpal ligament both proximally and       distally.  The open hemostat test was used well into the palm  and well into the distal forearm in order to make sure that  the       release was complete and thorough.  The median nerve was       visualized, but not otherwise handled.  The skin only was closed       with 4-0 nylon sutures alternating simple and mattress types.       Sterile dressings were applied with a volar splint and gently       stretched Ace compression wrap.  The patient was brought to       recovery in good condition.                            **PRELIMINARY REPORT UNLESS SIGNED**       SEE DOCUMENT IMAGING SYSTEM FOR FINAL REPORT                                 ________________________________                              Berniece Pap, MD                                                   ID:   [id number]       DocType:   [id number]       \:   SR       /:   [id number]       DD:  03/07/2011 08:12:19       DT:  03/07/2011 08:56:29       JOB: [id number]       CC:  [facility] [phone])       >  Authenticated by Berniece Pap., MD On 03/08/2011 07:38:56 AM

## 2013-01-17 NOTE — H&P (Signed)
[facility]                             HISTORY AND PHYSICAL              NAME: [name], [name]                  ADMITTED:   03/07/2011       MR#:  [id number]                            ACCT#:      [id number]       DOB:  1946/01/30                             PT TYPE/ROOM: S/--IO              _________________________________________________________________       CHIEF COMPLAINT:       Right hand numbness.              HISTORY OF THE PRESENT ILLNESS:       This is a 67 year old white female who complains of numbness in       the thumb, index, long, and ring fingers, but she does complain       of this symptom dorsal and also volar at the fingers.              PAST MEDICAL HISTORY:       Does include an adverse reaction to CODEINE.  She reacts with       itching all over her body so this may be a true allergy.              CURRENT MEDICATION LIST:       1.  Omeprazole 40 mg p.o. daily.       2.  Propranolol 80 mg p.o. b.i.d.       3.  Triamterene hydrochlorothiazide 37.5/25 mg p.o. daily.       4.  Simvastatin 40 mg p.o. daily.       5.  Amitriptyline 25 mg p.o. b.i.d.       6.  Calcium 600 mg p.o. b.i.d.       7.  Fish oil 1200 mg p.o. b.i.d.              SURGICAL HISTORY:       Includes appendectomy, hysterectomy, and left de Quervain       release, bladder suspension and bilateral cataract extractions.              REVIEW OF SYSTEMS:       Does not reveal any additional symptoms.  She does suffer with a       hyperacid stomach for which the omeprazole is directed,       hypertension for which the triamterene is directed and also the       propranolol, and hypercholesterolemia treated by the       simvastatin.              FAMILY HISTORY:       Does include myocardial infarctions in both parents and one       sibling, and although she does not have irritable bowel syndrome       herself, she has a sibling with Crohn's disease, a sibling  with       ulcerative colitis of course  plaqued by the corresponding       symptoms.              SOCIAL HISTORY:       Indicates that she works in a [employer].  She does       not smoke or use alcohol.  She lives with her husband.              PHYSICAL EXAMINATION:       GENERAL:  This is a 67 year old white female in no acute       distress.       HEAD, EYES, EARS, NOSE, and THROAT:  Within normal limits.       NECK and AXILLAE:  Free of masses.       LUNGS:  Clear to auscultation.       HEART:  Regular rhythm.  There are no gallops or murmurs.       ABDOMEN:  Nontender.  The bowel sounds are normal.              PERIPHERAL VASCULAR EXAM:  Reveals good pulses in all four       limbs.       NEUROLOGIC:  The sensory evaluation reveals a decrease in       sensitivity of the thumb, index, long, and ring.  Neck extension       combined with rotation to the right increases her numbness.       Tinel's sign is equivocal.  Phalen sign is positive on the       right.              DIAGNOSTIC STUDIES:       The EMG and nerve conduction velocity does confirm the presence       of carpal tunnel syndrome.  No sign of cervical radiculopathy is       detected.              DIAGNOSIS:       Carpal tunnel syndrome on the right.              PLAN:       The plan is for a carpal tunnel release on 02/14/2011.       Discussion was held concerning the pros, cons, and risks of the       procedure, and the final decision to proceed was made at the       time of this evaluation.                            **PRELIMINARY REPORT UNLESS SIGNED**       SEE DOCUMENT IMAGING SYSTEM FOR FINAL REPORT                                 ________________________________                              Berniece Pap, MD                                            ID:   [id number]  DocType:   [id number]       \Kem Boroughs       /:   [id number]       DD:  02/06/2011 18:08:36       DT:  02/07/2011 03:46:23       JOB: [id number]       CC:  [facility] [employer] ([id number])       >  Authenticated by  Berniece Pap., MD On 03/08/2011 07:39:01 AM

## 2013-01-18 NOTE — Discharge Summary (Signed)
[facility]                           DISCHARGE SUMMARY REPORT              NAME: Hannah, [name]                  ADMITTED:   10/15/2011       MR#:  [id number]                            DISCHARGED: 10/20/2011       DOB:  12-25-1945                           ACCT#:      [id number]                                                    PT TYPE/ROOM: I/--2S              _________________________________________________________________       ADMISSION DIAGNOSES:       1.  Severe sepsis/septic shock.       2.   Hypokalemia.       3.  Hypertension.       4.  Hyponatremia.       5.  Urinary tract infection.       6.  Syncope.              DISCHARGE DIAGNOSES:       1.  Septic shock, resolved.       2.  History of hypertension, stable on home medications.       3.  Hypokalemia, resolved.       4.  Hyponatremia, resolved.       5.  Urinary tract infection/pyelonephritis/right           hydronephrosis resolved, discharged on antibiotics.       6.  Syncope, resolved, most likely secondary to septic shock.       7.  History of gastroesophageal reflux disease, stable on           home medications.              HISTORY OF PRESENT ILLNESS:       This is a 67 year old, Caucasian female with a past medical       history of benign essential hypertension, migraine, GERD who       presented to the emergency department on October 15, 2011, with       complaint of weakness for several days as well as a fall the day       prior.  At the night prior to her admission, she reported       increased urine output with urinary frequency and a history of       UTIs in the past.  Upon admission in the emergency room, she met       SIRS criteria, and there was a concern for severe sepsis.  She       received a head CT, which was benign and her fall at home was       thought to be secondary to her developing sepsis.  She was       admitted on a sepsis protocol ______ the ICU at [facility].               HOSPITAL COURSE:       During course of hospitalization, the patient was found to be in       septic shocks shortly after her admission, she was given       extensive fluid resuscitation with normal saline.  She received       approximately 10 L.  She was also placed on Levophed briefly for       blood pressure stabilization and she was given IV antibiotics,       vancomycin and Zosyn.  An abdominal CT was obtained which showed       a right renal hydronephrosis and her septic shock was thought to       be secondary to urinary tract infection causing pyelonephritis       causing sepsis.  Her urine culture and blood culture were both       positive for pansensitive E. coli.  She was transitioned to       Keflex at time of discharge.  At time of discharge, her       leukocytosis, hypertension, hypokalemia, hypomagnesemia and       hypophosphatemia, had resolved.  She was discharged to home in a       stable condition with a plan for office followup.              LABORATORY DATA:              At discharge, white blood count 6, hemoglobin 9.7, hematocrit       29, platelets 250.  INR 1.  PT 10.5, sodium 142, potassium 4.9,       chloride 108, bicarb 27, calcium 8.8, glucose 89, creatinine       0.62, BUN of 30, phosphorus 2.7, magnesium 1.5, lactic acid       1.35.  Of note, at time of admission, lactic acid was 2.60.       Urinalysis on admission, positive for 3+ blood, 1+ leukocyte       esterase.  Negative nitrite, 1 urobilinogen, 20 to 40 RBCs, 3 to       4 white blood cells, moderate bacteria, 3+ protein, 1+ ketones,       1+ bilirubin.  Urine culture positive for E. coli, pan       sensitive.  Blood culture positive for E. coli, pan sensitive.              IMAGING/PROCEDURES:       ______ placed by Dr. Claudie Leach.  Chest x-ray, pleural       effusion secondary to fluid overload secondary to treatment of       septic shock.  CT abdomen pelvis without contrast, free fluid,       mild colitis not  excluded, perinephric stranding and fluid       greater on the right.  CT of head, no acute intracranial       findings.  Periventricular areas of hypoattenuation likely from       chronic small vessel ischemic changes.  Vascular calcifications       noted at the carotid siphons.              DISPOSITION:  To home with office followup.              DISCHARGE CONDITION:       Stable.              ACTIVITY:       As tolerated.              DIET:       Heart healthy.              DISCHARGE INSTRUCTIONS:       Please take all medications as prescribed.  Call MD for       persistent nausea, vomiting, diarrhea, oliguria, chest pain,       shortness of breath, bleeding, fever greater than 101, or any       other concerns.              MEDICATIONS AT TIME OF DISCHARGE:       1.  Keflex 500 mg 1 capsule four times daily x8 days,           total 14-day course.       2.  Tramadol 50 mg, 1-2 tabs four times daily p.r.n.       3.  Amitriptyline HCL 25 mg 1-2 tabs q.h.s.       4.  Omeprazole 40 mg 1 tab daily.       5.  Dyazide 3.75/25 mg 1 tab daily.       6.  Inderal LA 80 mg 1 cap daily.       7.  Zocor 40 mg 1 cap q.h.s.       8.  Relpax 20 mg one tab at headache onset for migraines.       9.  Diurex.       10.  CoQ 10.       11.  Antioxidant vitamins.                                            **PRELIMINARY REPORT UNLESS SIGNED**                                SEE DOCUMENT IMAGING SYSTEM FOR FINAL REPORT                                                        ________________________________                                               Lorenza Chick, MD (RES) For                                        Paulo Fruit, MD  ID:   [id number]                                                          DocType:   [id number]       \:   JR                                                                 /:   [id number]       DD:   10/22/2011 09:50:33                                                DT:  10/23/2011 00:55:03                                                JOB: [id number]                                                            CC:  [facility] [phone])            MICHAEL B VOORHEES ([id number])                   >                                                                  Authenticated by Paulo Fruit, MD On 11/20/2011 13:08:65 PM

## 2013-01-18 NOTE — Procedures (Signed)
[facility], [place]                               CARDIOVASCULAR LABORATORY           Two-dimensional echocardiogram.                            NAME: [name], [name]                              ROOM#: I/0003-A-IC              DOB:  [birth year]                                            AGE/SEX: 65/F       RU#:[id number]       [id number]       _________________________________________________________________________              TEST DATE: 10/16/2011                    REQ PHYS: Dianah Field, MD                                                                                              ORDER#: [id number]                            INT PHYS: Bing Quarry, MD       HGT: 63 inches                                                WEIGHT: 161       TECH: DE                                                        BP: 94/54       _________________________________________________________________________              DATE OF PROCEDURE:       10/16/2011              REQUESTING PHYSICIAN:       Dianah Field, MD              INT PHYSICIAN:       Bing Quarry, MD              HEIGHT:       63 inches              WEIGHT:  161              BLOOD PRESSURE:       94/54              TECHNICIAN:       DE              TYPE OF REPORT:       Two-dimensional echocardiogram.              TIME OF TEST:       11:36 AM              INDICATION:       Atrial tachycardia.              INTERPRETATION:       2-D and M-mode measurements were performed and reveal the following:              RVID (ED):  --   (2.1-3.2)  LVPW (ES):  1.2  (0.9-2.1)        IVS (ED):   0.90 (0.7-1.1)  AORTA:      3.1  (2.2-3.7)        LVID (ED):  3.6  (4.0-5.6)  LVOT:       --   (2.0-4.0)        LVPW (ED):  0.90 (0.7-1.1)  LA:         3.2  (2.5-4.0)        IVS (ES):   1.2  (0.8-2.0)  EF:         --   (55-77%)                LVID (ES):  2.3  (2.0-3.8)  EPSS:       --   (2-7 MM)                       DOPPLER/COLOR  FLOW:       MITRAL VALVE:  ACTUAL  NORMAL     AORTIC VALVE    ACTUAL  NORMAL           V MAX M/SEC:   --      (0.6-1.3)  V MAX M/SEC:    --      (1.0-1.7)        T 1/2 M/SEC:   --      (30-60)    LVOT VEL:       --      (0.7-1.1)        VALVE AREA:    --      (4-6 CM)   VALVE AREA:     --      (3-5 CM)         REGURG:        --                 REGURG:         --                       PISA:          --                 PRES 1/2 TIME:  --                       ERO:           --  AO MAX PG:      10.6                     RV:            --                 AO MEAN PG:     --                                     TRICUSPID VALVE  ACTUAL  NORMAL     PULMONIC VALVE  ACTUAL  NORMAL           V MAX M/SEC:     --      (0.3-0.7)  V MAX M/SEC:    --      (0.6-0.9)        REGURG:          --                 REGURG          --                       RV SYSTOLIC:     --      (15-30)                                             PRESSURE:        --                           Two-dimensional echo:  The study is of technically limited quality.  The       underlying rhythm appears to be atrial tachycardia, rate between 100 and       110.              1.  Aortic valve:  Mild sclerosis without stenosis.       2.  Mitral valve:  Anatomically normal without obvious thickening,           prolapse or vegetations.       3.  Tricuspid and pulmonic valves:  Not well seen.  No gross           abnormality.       4.  Chamber dimensions:  There is moderate right ventricular and           right atrial dilatation best appreciated in the apical four-chamber           view.       5.  Left ventricular systolic function is hyperdynamic with a           visually estimated ejection fraction in excess of 75%.  There are no           focal wall motion abnormalities.       6.  There is no pericardial effusion.       7.  No obvious intracardiac masses, thrombi, or vegetations seen.              DOPPLER COLOR FLOW IMAGING:              1.  Peak velocity  across the aortic valve is  normal.  There is no           significant aortic regurgitation.       2.  Left ventricular inflow pattern is normal.       3.  There is trace mitral and tricuspid insufficiency.  Estimated           right ventricular systolic pressure is 45.              CONCLUSION:       1.  Dilated right ventricle and right atrium with mildly elevated           right ventricular systolic pressure.       2.  Hyperdynamic left ventricular systolic function.                                            **PRELIMINARY REPORT UNLESS SIGNED**                                        SEE DOCUMENT IMAGING SYSTEM FOR FINAL REPORT                                                                        ________________________________                                                Bing Quarry, MD                                                                                                                                 ID:   [id number]                                                                  DocType:   [id number]       \:   VH                                                                         /:  251       DD:  10/16/2011 17:05:05                                                        DT:  10/16/2011 19:30:42                                                        JOB: [id number]                                                                    >                                                                          Authenticated by Bing Quarry., MD On 10/17/2011 09:49:11 AM

## 2013-01-18 NOTE — Op Note (Signed)
[facility]                           OPERATIVE/PROCEDURE REPORT              NAME: [name], [name]                  ADMITTED:   10/15/2011       MR#:  [id number]                            ACCT#:      [id number]       DOB:  Feb 12, 1946                        PT TYPE/ROOM: I/0003-A-IC              _________________________________________________________________       DATE OF PROCEDURE:       10/15/2011              PREOPERATIVE DIAGNOSIS:       Sepsis.              POSTOPERATIVE DIAGNOSIS:       Sepsis.              PROCEDURES:       Placement of left subclavian triple-lumen catheter.              PATHOLOGY:       Specimen sent was none.              ANESTHETIC:       1% lidocaine.              SURGEON:       Claudie Leach, MD              INDICATIONS:       The patient is a 67 year old with sepsis, which has not       responded to fluid challenges.  There is a need for pressors and       monitoring of central pressure.              FINDINGS:       At time of procedure, several sticks were made to gain access to       the left subclavian vein.  Chest x-ray demonstrates the vein to       be in the distal superior vena cava or proximal right atrium.       There is no evidence of pneumothorax.  There is good position.              DESCRIPTION OF PROCEDURE:       The patient was in her room in the ICU with the nurse in       attendance.  Initially, a time-out ensued and appropriate       consent was obtained from the patient.  She was placed in the       Trendelenburg position.  Her left chest and neck were prepped       and draped in a standard sterile fashion after allowing the       chlorhexidine alcohol solution to dry.  Aseptic technique was       used for the entire procedure.  1% lidocaine was used to inject       the subclavian fossa.  Then,  using the materials in the kit, a       venous stick was then performed with several attempts using the       Seldinger technique,  first a wire and then a dilator and then       the triple-lumen catheter were placed into the subclavian vein.       The catheter was sutured in place at four points with the       materials again available in the kit.  The blood was cleansed       from the wound with the dressing kit, and a sterile dressing was       then placed.  The drapes were removed from the patient.  A chest       x-ray was obtained with the above findings.                                                   **PRELIMINARY REPORT UNLESS SIGNED**                                SEE DOCUMENT IMAGING SYSTEM FOR FINAL REPORT                                                        ________________________________                                        Claudie Leach, MD                                                                                                                  ID:   [id number]                                                          DocType:   [id number]       \:   KR                                                                 /:   [id number]       DD:  10/15/2011 22:48:32  DT:  10/15/2011 23:16:46                                                JOB: [id number]                                                            CC:  [facility] [phone])            MICHAEL B VOORHEES ([id number])                   >                                                                  Authenticated by Claudie Leach, MD On 10/19/2011 02:12:48 PM

## 2013-01-18 NOTE — Consults (Signed)
[facility]                              CONSULTATION REPORT              NAME: [name], [name]                  ADMITTED:   10/15/2011       MR#:  [id number]                            ACCT#:      [id number]       DOB:  1946/07/22                        PT TYPE/ROOM: I/0003-A-IC              _________________________________________________________________       DATE OF CONSULT:       10/15/2011              REQUESTING PHYSICIAN:       [employer].              CONSULTING SERVICE:       Consult is to General Surgery.              REASON FOR CONSULTATION:       Sepsis, concern with abdominal source.              HISTORY OF PRESENT ILLNESS:       The patient is a 67 year old, who has been feeling poorly for a       few days.  She presented to the emergency department today with       weakness, nausea and vomiting over the last few days, and was       seen and evaluated by [facility].  She was denying any       abdominal pain.  She reported urinary frequency with increased       urine output.  She denied any dysuria.  As I am seeing her, she       is complaining of "pain in my left kidney."  She does have a       history of urinary tract infections in the past.  She denies any       diarrhea.  No blood in her stool.  No change in her bowel       habits.  She is currently not having any abdominal pain just the       left flank pain.  She is feeling bloated after she drank the       oral prep for the CT scan of the abdomen and pelvis, but has had       no vomiting.  Again, she is denying pain.              PAST MEDICAL HISTORY:       Significant for:              1.  Hypertension.       2.  Migraine.       3.  Gastroesophageal reflux disease.       4.  Atopic dermatitis.              PAST SURGICAL HISTORY:       Includes an appendectomy,  hysterectomy, bladder suspension, and       bilateral carpal tunnel release.              ALLERGIES:       She is allergic to  CODEINE.  She develops a rash.              HOME MEDICATIONS:       Include:              1.  Amitriptyline 25 mg at bedtime.       2.  Calcium.       3.  Coenzyme Q10.       4.  Inderal LA 80 mg b.i.d.       5.  Omeprazole 40 mg daily.       6.  Relpax 20 mg daily.              7.  Zocor 40 mg daily.              REVIEW OF SYSTEMS:       She at home, had no fevers, chills or sweats, though she has       been febrile here.  She has the above symptoms.  She has had no       significant weight loss or weight gain recently.  No significant       problems with abdominal pain and again no diarrhea.  She had a       normal colonoscopy in 2005.  She has had no problems with chest       pain, cough or shortness of breath.  No runny nose or sinus       infection.  She denies any perianal pain or difficulty sitting.              PHYSICAL EXAMINATION:       GENERAL:  Reveals a well-developed, well-nourished, late       middle-aged female, who is slightly somnolent.       VITAL SIGNS:  Her temperature maximum is 38.7, blood pressure is       87/48, heart rate is 100-110, respiratory rate is 24.  Her       oxygen saturation is 95% to 100% on 2 L nasal cannula.       ABDOMEN:  Distended.  She has few bowel sounds.  She is soft       throughout.  There is no tenderness or no guarding.  I can       appreciate no mass.  She has left costovertebral angle       tenderness.  No suprapubic tenderness at this time.              LABORATORY DATA:       Remarkable for a white count of 8.2 with a left shift,       hemoglobin 9.6, platelet count of 113.  Complete metabolic panel       with a glucose of 165, sodium of 135, potassium of 3, chloride       106, CO2 22, BUN 12, creatinine 0.76, albumin 2, total bili is       1.4.  Phosphorus is 0.8, magnesium 1.5, lipase 11, amylase 29,       lactate is 2.6, which is down from 2.99 earlier.  Urinalysis is       slightly abnormal with 3+ blood, 1+ leukocyte esterase, nitrite       negative,  3-4  white cells, moderate bacteria.              ASSESSMENT:       The patient certainly is septic, etiology is at this point       undetermined.  She is being treated aggressively with IV       antibiotics and fluids and pressor resuscitation.  My initial       presumption is left pyelonephritis, however, cannot rule out       intra-abdominal source.  I agree with proceeding with CT scan of       the abdomen and pelvis and will certainly follow with you until       that has been obtained.                                            **PRELIMINARY REPORT UNLESS SIGNED**                                SEE DOCUMENT IMAGING SYSTEM FOR FINAL REPORT                                                        ________________________________                                        Claudie Leach, MD                                                                                                                  ID:   [id number]                                                          DocType:   [id number]       \:   RR                                                                 /:   [id number]       DD:  10/15/2011 22:51:11  DT:  10/16/2011 07:07:12                                                JOB: [id number]                                                                   CC:  [facility] [phone])            MICHAEL B VOORHEES ([id number])                   >                                                                  Authenticated by Claudie Leach, MD On 10/19/2011 02:26:19 PM

## 2013-09-16 ENCOUNTER — Ambulatory Visit
Admission: RE | Admit: 2013-09-16 | Discharge: 2013-09-16 | Disposition: A | Discharge status: Home / Self Care | Payer: Commercial Managed Care - POS | Source: Ambulatory Visit | Attending: Nurse Practitioner | Admitting: Nurse Practitioner

## 2013-09-16 DIAGNOSIS — E876 Hypokalemia: Secondary | ICD-10-CM | POA: Insufficient documentation

## 2013-09-16 LAB — COMPREHENSIVE METABOLIC PANEL
ALT: 24 U/L (ref 0–55)
AST (SGOT): 23 U/L (ref 10–42)
Albumin/Globulin Ratio: 1.59 Ratio — ABNORMAL HIGH (ref 0.70–1.50)
Albumin: 4.4 gm/dL (ref 3.5–5.0)
Alkaline Phosphatase: 80 U/L (ref 40–145)
Anion Gap: 15.5 mMol/L (ref 7.0–18.0)
BUN / Creatinine Ratio: 16.7 Ratio (ref 10.0–30.0)
BUN: 14 mg/dL (ref 7–22)
Bilirubin, Total: 0.4 mg/dL (ref 0.1–1.2)
CO2: 30 mMol/L (ref 20.0–30.0)
Calcium: 10.1 mg/dL (ref 8.5–10.5)
Chloride: 93 mMol/L — ABNORMAL LOW (ref 98–110)
Creatinine: 0.84 mg/dL (ref 0.60–1.20)
EGFR: >60 mL/min/{1.73_m2}
Globulin: 2.7 gm/dL (ref 2.0–4.0)
Glucose: 106 mg/dL — ABNORMAL HIGH (ref 70–99)
Osmolality Calc: 269 mOsm/kg — ABNORMAL LOW (ref 275–300)
Potassium: 4.5 mMol/L (ref 3.5–5.3)
Protein, Total: 7.1 gm/dL (ref 6.0–8.3)
Sodium: 134 mMol/L — ABNORMAL LOW (ref 136–147)

## 2013-09-16 LAB — MAGNESIUM: Magnesium: 2.1 mg/dL (ref 1.6–2.6)

## 2014-05-15 ENCOUNTER — Encounter (INDEPENDENT_AMBULATORY_CARE_PROVIDER_SITE_OTHER): Payer: Self-pay

## 2014-05-15 ENCOUNTER — Ambulatory Visit (INDEPENDENT_AMBULATORY_CARE_PROVIDER_SITE_OTHER): Payer: Commercial Managed Care - POS | Admitting: Physician Assistant

## 2014-05-15 VITALS — BP 129/71 | HR 99 | Temp 97.8°F | Resp 16 | Ht 63.0 in | Wt 165.0 lb

## 2014-05-15 DIAGNOSIS — L240 Irritant contact dermatitis due to detergents: Secondary | ICD-10-CM

## 2014-05-15 MED ORDER — TRIAMCINOLONE ACETONIDE 0.5 % EX OINT
TOPICAL_OINTMENT | Freq: Two times a day (BID) | CUTANEOUS | Status: DC
Start: 2014-05-15 — End: 2014-09-17

## 2014-05-15 MED ORDER — HYDROXYZINE HCL 25 MG PO TABS
25.0000 mg | ORAL_TABLET | Freq: Three times a day (TID) | ORAL | Status: DC | PRN
Start: 2014-05-15 — End: 2014-09-17

## 2014-05-15 NOTE — Progress Notes (Signed)
Subjective:    Patient ID: [name] is a 68 y.o. female.    HPI Comments: Patient states she has been having an itchy red rash the past month that keeps spreading without improvement of OTC creams.  She denies any new foods, soaps, detergents, or medication to cause rash. No fevers, chills, nausea, vomiting or diarrhea. No shortness of breath or chest pain.    Rash  This is a new problem. The current episode started 1 to 4 weeks ago. The problem has been gradually worsening since onset. The rash is diffuse. The rash is characterized by itchiness and redness. It is unknown if there was an exposure to a precipitant. Pertinent negatives include no anorexia, congestion, cough, diarrhea, eye pain, facial edema, fatigue, fever, joint pain, nail changes, rhinorrhea, shortness of breath, sore throat or vomiting. Past treatments include anti-itch cream.       The following portions of the patient's history were reviewed and updated as appropriate: allergies, current medications, past family history, past medical history, past social history, past surgical history and problem list.    Review of Systems   Constitutional: Negative.  Negative for fever and fatigue.   HENT: Negative.  Negative for congestion, rhinorrhea and sore throat.    Eyes: Negative for pain.   Respiratory: Negative.  Negative for cough and shortness of breath.    Cardiovascular: Negative.    Gastrointestinal: Negative.  Negative for vomiting, diarrhea and anorexia.   Genitourinary: Negative.    Musculoskeletal: Negative.  Negative for joint pain.   Skin: Positive for rash. Negative for nail changes.   Neurological: Negative.          Objective:    BP 129/71 mmHg  Pulse 99  Temp(Src) 97.8 F (36.6 C) (Oral)  Resp 16  Ht 1.6 m (5\' 3" )  Wt 74.844 kg (165 lb)  BMI 29.24 kg/m2    Physical Exam   Constitutional: She is oriented to person, place, and time. She appears well-developed and well-nourished. No distress.   HENT:   Mouth/Throat:  Oropharynx is clear and moist. No oropharyngeal exudate.   Neck: Normal range of motion. Neck supple.   Cardiovascular: Normal rate and normal heart sounds.  An irregular rhythm present.   Patient aware of extra heart beat and had work up for this when she was admitted in the hospital and was told no abnormality.   Pulmonary/Chest: Effort normal and breath sounds normal.   Lymphadenopathy:     She has no cervical adenopathy.   Neurological: She is alert and oriented to person, place, and time.   Skin: Rash noted. She is not diaphoretic.   Psychiatric: She has a normal mood and affect. Her behavior is normal.   Nursing note and vitals reviewed.        Assessment and Plan:       Lab Results from today's visit:  No results found for this or any previous visit (from the past 4 hour(s)).    Radiology Results from today's visit:  No results found.    Assessment/Plan for today's visit:  1. Contact dermatitis due to detergent, unspecified contact dermatitis  triamcinolone (KENALOG) 0.5 % ointment    hydrOXYzine (ATARAX) 25 MG tablet       Leanord Asal, [place]  [facility]  05/15/2014 6:31 PM        Patient Instructions     Contact Dermatitis [General]  The rash that you have is a reaction to an irritant that  you have come in contact with. This may be due to any of the following: plants (such as poison oak or ivy); chemicals (such as hair dyes and rinses, soaps, solvents, waxes, fingernail polish, deodorants). Also, jewelry or watchbands made of nickel can cause a reaction. The rash may itch or feel irritated. This rash is not contagious. Treatment for this problem requires avoiding further exposure to whatever caused this reaction. It also includes treating the skin with medicine to reduce irritation.  Home Care:   Avoid anything that heats up your skin (hot showers/baths, direct sunlight) since this will tend to make itching worse.   For weeping, blistered areas apply cold compresses. (Dip a face cloth into a  mixture of one pint of cold water with one packet of Domeboro Powder or Aveeno Oatmeal powder (available at drug stores). This should be done for 30 minutes three to four times a day. Keep the solution refrigerated for future use. If large areas of skin are involved, you may take a lukewarm bath with one cup of cornstarch or Aveeno Oatmeal powder added to the water.   For localized rash, use hydrocortisone cream (available over-the-counter) for redness and irritation, unless another medicine was prescribed. For severe itching, apply an ice compress locally. You can also use benzocaine anesthetic cream or spray (available at drug and grocery stores as Lanacaine or [employer]).   Oral Benadryl (diphenhydramine) is an antihistamine available at drug and grocery stores. Unless a prescription antihistamine was given, Benadryl may be used to reduce itching if large areas of the skin are involved. Use lower doses during the daytime and higher doses at bedtime since the drug may make you sleepy. [NOTE: Do not use Benadryl if you have glaucoma or if you are a man with trouble urinating due to an enlarged prostate.] Claritin (loratadine) is an antihistamine that causes less drowsiness and is a good alternative for daytime use.   If your reaction is due to a plant exposure , it is important to wash all of the plant oils off of your skin and the clothes you were wearing when you contacted the plant. Use ordinary soap and water to wash from top of your head to your toes. Launder all clothes that you were wearing in hot water with ordinary laundry detergent.  Follow Up  with your doctor or this facility as directed.  Get Prompt Medical Attention  if any of the following occur:   Spreading of the rash to other parts of the body   Severe swelling of the face, eyelids, mouth, throat or tongue   Difficulty urinating due to swelling in the genital area   Signs of infection in the areas of broken blisters:   Spreading  redness   Pus or fluid draining from the blisters   Yellow-brown crusts form over the open blisters   Fever of 100.34F(38C) or higher, or as directed by your healthcare provider   2000-2014 [employer], [facility]. [address] [place], [place] [address]. All rights reserved. This information is not intended as a substitute for professional medical care. Always follow your healthcare professional's instructions.            PATIENT AWARE HYDROXYZINE WILL MAKE HER DROWSY AND HOME USE ONLY, KENALOG CREAM FOR RASH, NOT ON FACE. CLOSE FOLLOW UP WITH PCP AND AWARE TO DISCUSS HEART IRREGULAR BEAT WITH PCP, WORRISOME OR WORSENING SYMPTOMS OF CONCERN PLEASE PROCEED TO ER.  Leanord Asal, [place]  [facility]  05/15/2014  6:31 PM

## 2014-05-15 NOTE — Patient Instructions (Signed)
Contact Dermatitis [General]  The rash that you have is a reaction to an irritant that you have come in contact with. This may be due to any of the following: plants (such as poison oak or ivy); chemicals (such as hair dyes and rinses, soaps, solvents, waxes, fingernail polish, deodorants). Also, jewelry or watchbands made of nickel can cause a reaction. The rash may itch or feel irritated. This rash is not contagious. Treatment for this problem requires avoiding further exposure to whatever caused this reaction. It also includes treating the skin with medicine to reduce irritation.  Home Care:   Avoid anything that heats up your skin (hot showers/baths, direct sunlight) since this will tend to make itching worse.   For weeping, blistered areas apply cold compresses. (Dip a face cloth into a mixture of one pint of cold water with one packet of Domeboro Powder or Aveeno Oatmeal powder (available at drug stores). This should be done for 30 minutes three to four times a day. Keep the solution refrigerated for future use. If large areas of skin are involved, you may take a lukewarm bath with one cup of cornstarch or Aveeno Oatmeal powder added to the water.   For localized rash, use hydrocortisone cream (available over-the-counter) for redness and irritation, unless another medicine was prescribed. For severe itching, apply an ice compress locally. You can also use benzocaine anesthetic cream or spray (available at drug and grocery stores as Lanacaine or Solarcaine).   Oral Benadryl (diphenhydramine) is an antihistamine available at drug and grocery stores. Unless a prescription antihistamine was given, Benadryl may be used to reduce itching if large areas of the skin are involved. Use lower doses during the daytime and higher doses at bedtime since the drug may make you sleepy. [NOTE: Do not use Benadryl if you have glaucoma or if you are a man with trouble urinating due to an enlarged prostate.] Claritin  (loratadine) is an antihistamine that causes less drowsiness and is a good alternative for daytime use.   If your reaction is due to a plant exposure , it is important to wash all of the plant oils off of your skin and the clothes you were wearing when you contacted the plant. Use ordinary soap and water to wash from top of your head to your toes. Launder all clothes that you were wearing in hot water with ordinary laundry detergent.  Follow Up  with your doctor or this facility as directed.  Get Prompt Medical Attention  if any of the following occur:   Spreading of the rash to other parts of the body   Severe swelling of the face, eyelids, mouth, throat or tongue   Difficulty urinating due to swelling in the genital area   Signs of infection in the areas of broken blisters:   Spreading redness   Pus or fluid draining from the blisters   Yellow-brown crusts form over the open blisters   Fever of 100.4F(38C) or higher, or as directed by your healthcare provider   2000-2014 [employer]. [address], [place] [address]. All rights reserved. This information is not intended as a substitute for professional medical care. Always follow your healthcare professional's instructions.

## 2014-09-17 ENCOUNTER — Emergency Department: Payer: BC Managed Care – PPO

## 2014-09-17 ENCOUNTER — Observation Stay
Admission: EM | Admit: 2014-09-17 | Discharge: 2014-09-19 | Disposition: A | Discharge status: Home / Self Care | Payer: BC Managed Care – PPO | Attending: Family Medicine | Admitting: Family Medicine

## 2014-09-17 ENCOUNTER — Observation Stay: Payer: BC Managed Care – PPO | Admitting: Family Medicine

## 2014-09-17 DIAGNOSIS — K529 Noninfective gastroenteritis and colitis, unspecified: Secondary | ICD-10-CM

## 2014-09-17 DIAGNOSIS — I4719 Other supraventricular tachycardia: Secondary | ICD-10-CM | POA: Diagnosis present

## 2014-09-17 DIAGNOSIS — E7800 Pure hypercholesterolemia, unspecified: Secondary | ICD-10-CM | POA: Diagnosis present

## 2014-09-17 DIAGNOSIS — G43909 Migraine, unspecified, not intractable, without status migrainosus: Secondary | ICD-10-CM | POA: Insufficient documentation

## 2014-09-17 DIAGNOSIS — I471 Supraventricular tachycardia: Secondary | ICD-10-CM | POA: Insufficient documentation

## 2014-09-17 DIAGNOSIS — Z79899 Other long term (current) drug therapy: Secondary | ICD-10-CM | POA: Insufficient documentation

## 2014-09-17 DIAGNOSIS — E876 Hypokalemia: Secondary | ICD-10-CM | POA: Insufficient documentation

## 2014-09-17 DIAGNOSIS — E860 Dehydration: Secondary | ICD-10-CM | POA: Diagnosis present

## 2014-09-17 DIAGNOSIS — R109 Unspecified abdominal pain: Secondary | ICD-10-CM | POA: Insufficient documentation

## 2014-09-17 DIAGNOSIS — K219 Gastro-esophageal reflux disease without esophagitis: Secondary | ICD-10-CM | POA: Insufficient documentation

## 2014-09-17 DIAGNOSIS — E785 Hyperlipidemia, unspecified: Secondary | ICD-10-CM | POA: Insufficient documentation

## 2014-09-17 DIAGNOSIS — R197 Diarrhea, unspecified: Secondary | ICD-10-CM | POA: Diagnosis present

## 2014-09-17 DIAGNOSIS — A047 Enterocolitis due to Clostridium difficile: Principal | ICD-10-CM | POA: Insufficient documentation

## 2014-09-17 DIAGNOSIS — I10 Essential (primary) hypertension: Secondary | ICD-10-CM | POA: Diagnosis present

## 2014-09-17 HISTORY — DX: Unspecified osteoarthritis, unspecified site: M19.90

## 2014-09-17 LAB — URINALYSIS, REFLEX TO MICROSCOPIC EXAM IF INDICATED
Bilirubin, UA: NEGATIVE mg/dL
Blood, UA: NEGATIVE mg/dL
Glucose, UA: NEGATIVE mg/dL
Ketones UA: NEGATIVE
Nitrite, UA: NEGATIVE
Protein, UR: NEGATIVE mg/dL
Urine Specific Gravity: 1.004 (ref 1.001–1.040)
Urobilinogen, UA: 0.2 mg/dL
pH, Urine: 7 pH (ref 5.0–8.0)

## 2014-09-17 LAB — ECG 12-LEAD
P Wave Axis: 50 deg
P Wave Duration: 120 ms
P-R Interval: 182 ms
Patient Age: 68 years
Patient Age: 68 years
Q-T Dispersion: 34 ms
Q-T Dispersion: 36 ms
Q-T Interval(Corrected): 445 ms
Q-T Interval(Corrected): 452 ms
Q-T Interval: 366 ms
Q-T Interval: 410 ms
QRS Axis: 0 deg
QRS Axis: -3 deg
QRS Duration: 80 ms
QRS Duration: 110 ms
T Axis: 33 deg
T Axis: 9 deg
Ventricular Rate: 89 /min
Ventricular Rate: 73 /min

## 2014-09-17 LAB — COMPREHENSIVE METABOLIC PANEL
ALT: 14 U/L (ref 0–55)
AST (SGOT): 17 U/L (ref 10–42)
Albumin/Globulin Ratio: 1.33 Ratio (ref 0.70–1.50)
Albumin: 3.7 gm/dL (ref 3.5–5.0)
Alkaline Phosphatase: 67 U/L (ref 40–145)
Anion Gap: 14.2 mMol/L (ref 7.0–18.0)
BUN / Creatinine Ratio: 15.4 Ratio (ref 10.0–30.0)
BUN: 12 mg/dL (ref 7–22)
Bilirubin, Total: 1 mg/dL (ref 0.1–1.2)
CO2: 26 mMol/L (ref 20.0–30.0)
Calcium: 9.4 mg/dL (ref 8.5–10.5)
Chloride: 103 mMol/L (ref 98–110)
Creatinine: 0.78 mg/dL (ref 0.60–1.20)
EGFR: >60 mL/min/{1.73_m2}
Globulin: 2.8 gm/dL (ref 2.0–4.0)
Glucose: 97 mg/dL (ref 70–99)
Osmolality Calc: 279 mOsm/kg (ref 275–300)
Potassium: 3.2 mMol/L — ABNORMAL LOW (ref 3.5–5.3)
Protein, Total: 6.4 gm/dL (ref 6.0–8.3)
Sodium: 140 mMol/L (ref 136–147)

## 2014-09-17 LAB — BASIC METABOLIC PANEL
Anion Gap: 10.3 mMol/L (ref 7.0–18.0)
BUN / Creatinine Ratio: 12.2 Ratio (ref 10.0–30.0)
BUN: 9 mg/dL (ref 7–22)
CO2: 26 mMol/L (ref 20.0–30.0)
Calcium: 8.7 mg/dL (ref 8.5–10.5)
Chloride: 108 mMol/L (ref 98–110)
Creatinine: 0.74 mg/dL (ref 0.60–1.20)
EGFR: >60 mL/min/{1.73_m2}
Glucose: 118 mg/dL — ABNORMAL HIGH (ref 70–99)
Osmolality Calc: 281 mOsm/kg (ref 275–300)
Potassium: 3.3 mMol/L — ABNORMAL LOW (ref 3.5–5.3)
Sodium: 141 mMol/L (ref 136–147)

## 2014-09-17 LAB — CBC AND DIFFERENTIAL
Basophils %: 0.6 % (ref 0.0–3.0)
Basophils Absolute: 0 10*3/uL (ref 0.0–0.3)
Eosinophils %: 1.4 % (ref 0.0–7.0)
Eosinophils Absolute: 0.1 10*3/uL (ref 0.0–0.8)
Hematocrit: 36.8 % (ref 36.0–48.0)
Hemoglobin: 12.2 gm/dL (ref 12.0–16.0)
Lymphocytes Absolute: 1.4 10*3/uL (ref 0.6–5.1)
Lymphocytes: 20.2 % (ref 15.0–46.0)
MCH: 31 pg (ref 28–35)
MCHC: 33 gm/dL (ref 33–37)
MCV: 94 fL (ref 80–100)
MPV: 7.9 fL — ABNORMAL LOW (ref 8.0–12.0)
Monocytes Absolute: 0.6 10*3/uL (ref 0.1–1.7)
Monocytes: 8.6 % (ref 3.0–15.0)
Neutrophils %: 69.2 % (ref 42.0–78.0)
Neutrophils Absolute: 4.9 10*3/uL (ref 1.7–8.6)
PLT CT: 267 10*3/uL (ref 130–440)
RBC: 3.94 10*6/uL (ref 3.80–5.00)
RDW: 14 % (ref 12.0–15.0)
WBC: 7 10*3/uL (ref 4.0–11.0)

## 2014-09-17 LAB — MAGNESIUM: Magnesium: 2.1 mg/dL (ref 1.6–2.6)

## 2014-09-17 LAB — PT/INR
PT INR: 1 (ref 0.9–1.2)
PT: 10.6 s (ref 9.7–12.3)

## 2014-09-17 LAB — TROPONIN I: Troponin I: 0.01 ng/mL (ref 0.00–0.02)

## 2014-09-17 LAB — VH C. DIFFICILE TOXIN B GENE BY DNA AMPLIFICATION: Stool Clostridium difficile Toxin B Gene DNA Amplification: POSITIVE — CR

## 2014-09-17 LAB — PHOSPHORUS: Phosphorus: 3.6 mg/dL (ref 2.3–4.7)

## 2014-09-17 MED ORDER — PRAVASTATIN SODIUM 20 MG PO TABS
40.0000 mg | ORAL_TABLET | Freq: Every evening | ORAL | Status: DC
Start: 2014-09-17 — End: 2014-09-19
  Administered 2014-09-17 – 2014-09-18 (×2): 40 mg via ORAL
  Filled 2014-09-17 (×3): qty 2

## 2014-09-17 MED ORDER — FAMOTIDINE 20 MG PO TABS
20.0000 mg | ORAL_TABLET | Freq: Two times a day (BID) | ORAL | Status: DC
Start: 2014-09-17 — End: 2014-09-19
  Administered 2014-09-17 – 2014-09-19 (×4): 20 mg via ORAL
  Filled 2014-09-17 (×6): qty 1

## 2014-09-17 MED ORDER — IOHEXOL 240 MG/ML IJ SOLN
INTRAMUSCULAR | Status: AC
Start: 2014-09-17 — End: ?
  Filled 2014-09-17: qty 50

## 2014-09-17 MED ORDER — PROPRANOLOL HCL ER 80 MG PO CP24
160.0000 mg | ORAL_CAPSULE | Freq: Every day | ORAL | Status: DC
Start: 2014-09-17 — End: 2014-09-19
  Administered 2014-09-18 – 2014-09-19 (×2): 160 mg via ORAL
  Filled 2014-09-17 (×4): qty 2

## 2014-09-17 MED ORDER — POTASSIUM CHLORIDE 10 MEQ/100ML IV SOLN
INTRAVENOUS | Status: AC
Start: 2014-09-17 — End: ?
  Filled 2014-09-17: qty 100

## 2014-09-17 MED ORDER — SODIUM CHLORIDE 0.9 % IV SOLN
Freq: Once | INTRAVENOUS | Status: DC
Start: 2014-09-17 — End: 2014-09-17

## 2014-09-17 MED ORDER — SODIUM CHLORIDE 0.9 % IV BOLUS
1000.0000 mL | Freq: Once | INTRAVENOUS | Status: AC
Start: 2014-09-17 — End: 2014-09-17
  Administered 2014-09-17: 1000 mL via INTRAVENOUS

## 2014-09-17 MED ORDER — METRONIDAZOLE 500 MG PO TABS
500.0000 mg | ORAL_TABLET | Freq: Three times a day (TID) | ORAL | Status: DC
Start: 2014-09-17 — End: 2014-09-19
  Administered 2014-09-17 – 2014-09-19 (×6): 500 mg via ORAL
  Filled 2014-09-17 (×11): qty 1

## 2014-09-17 MED ORDER — METOPROLOL TARTRATE 1 MG/ML IV SOLN
5.0000 mg | INTRAVENOUS | Status: DC | PRN
Start: 2014-09-17 — End: 2014-09-17

## 2014-09-17 MED ORDER — ENOXAPARIN SODIUM 40 MG/0.4ML SC SOLN
40.0000 mg | SUBCUTANEOUS | Status: DC
Start: 2014-09-17 — End: 2014-09-19
  Administered 2014-09-17 – 2014-09-18 (×2): 40 mg via SUBCUTANEOUS
  Filled 2014-09-17 (×4): qty 0.4

## 2014-09-17 MED ORDER — IOHEXOL 240 MG/ML IJ SOLN
50.0000 mL | Freq: Once | INTRAMUSCULAR | Status: AC
Start: 2014-09-17 — End: 2014-09-17
  Administered 2014-09-17: 50 mL via ORAL

## 2014-09-17 MED ORDER — IOHEXOL 300 MG/ML IJ SOLN
100.0000 mL | Freq: Once | INTRAMUSCULAR | Status: AC | PRN
Start: 2014-09-17 — End: 2014-09-17
  Administered 2014-09-17: 100 mL via INTRAVENOUS
  Filled 2014-09-17: qty 100

## 2014-09-17 MED ORDER — LACTATED RINGERS IV SOLN
INTRAVENOUS | Status: DC
Start: 2014-09-17 — End: 2014-09-17

## 2014-09-17 MED ORDER — KCL IN DEXTROSE-NACL 40-5-0.45 MEQ/L-%-% IV SOLN
INTRAVENOUS | Status: DC
Start: 2014-09-17 — End: 2014-09-19
  Administered 2014-09-17: 125 mL/h via INTRAVENOUS
  Filled 2014-09-17 (×5): qty 1000

## 2014-09-17 MED ORDER — SODIUM CHLORIDE 0.9 % IV BOLUS
1000.0000 mL | Freq: Once | INTRAVENOUS | Status: DC
Start: 2014-09-17 — End: 2014-09-17

## 2014-09-17 MED ORDER — HYDROCORTISONE ACETATE 25 MG RE SUPP
25.0000 mg | Freq: Two times a day (BID) | RECTAL | Status: DC
Start: 2014-09-17 — End: 2014-09-19
  Filled 2014-09-17 (×6): qty 1

## 2014-09-17 MED ORDER — AMITRIPTYLINE HCL 50 MG PO TABS
50.0000 mg | ORAL_TABLET | Freq: Every evening | ORAL | Status: DC
Start: 2014-09-17 — End: 2014-09-19
  Administered 2014-09-17 – 2014-09-18 (×2): 50 mg via ORAL
  Filled 2014-09-17 (×3): qty 1

## 2014-09-17 MED ORDER — POTASSIUM CHLORIDE 10 MEQ/100ML IV SOLN
10.0000 meq | Freq: Once | INTRAVENOUS | Status: AC
Start: 2014-09-17 — End: 2014-09-17
  Administered 2014-09-17: 10 meq via INTRAVENOUS

## 2014-09-17 NOTE — ED Notes (Signed)
Dr. Diona Foley arrived to the ED.

## 2014-09-17 NOTE — ED Notes (Signed)
[facility].  HAS HAD DIARRHEA FOR 4 DAYS.   3-4 MUCUS STOOLS TODAY.

## 2014-09-17 NOTE — Plan of Care (Signed)
Problem: Safety  Goal: Patient will be free from injury during hospitalization  Outcome: Progressing

## 2014-09-17 NOTE — Plan of Care (Signed)
Pt remains pain free, and free from falls. Small amount of loose stool. Educated on role of diuretics and diarrhea in potassium depletion.

## 2014-09-17 NOTE — ED Notes (Signed)
Dr William Kerns arrived to the ED.

## 2014-09-17 NOTE — ED Notes (Addendum)
Patient being Admitted to Dr. Rande Brunt for MAT and Diarrhea - Isolations Precautions - [facility] Status 23 Hr Observation 211.  Orders written by Dr. Diona Foley at 14:21 pm. I am speaking Vernona Rieger at ED Registration at 14:13 pm. I am calling [place] spoke to Amy at 14:55 pm.

## 2014-09-17 NOTE — ED Notes (Signed)
211 Bill [id number]

## 2014-09-17 NOTE — Progress Notes (Signed)
Rec'd report from [place], [facility] fund[facility]. Pt sitting up in bed, ate 100% dinner. Denies needs and pain. Continues to have small amounts of loose stool.

## 2014-09-17 NOTE — H&P (Signed)
ADMISSION HISTORY AND PHYSICAL EXAM    Date Time: 09/17/2014 1:57 PM  Patient Name: [facility]  Attending Physician: Fredrik Rigger, MD  Primary Care Physician: Gershon Crane, FNP    CC: diarrhea and abnormal EKG  Code Status: full code     History of Presenting Illness:     [name] is a 69 y.o. female who presents to the hospital from her PCP's office with about 3 weeks of diarrhea, she was found to be in an irregular rhythm on EKG. The diarrhea was treated initially with cipro and was better until she finished the cipro and then over the past 30-4 days has worsened again. She is now having 6-8 loose bowel movements daily. Associated with chills, generalized abdominal discomfort, sweating with bowel movements, blood (from hemorrhoids) in the diarrhea, and a 17 pound weight loss. She has decreased her PO intake because eating and drinking seems to induce diarrhea. She was diagnosed with MAT on EKG, consistent with findings in the ED. Treatment for hypokalemia was also initiated in the ED.     Work up for incidental PACs 3 years ago was negative.     Past Medical History:     Past Medical History   Diagnosis Date   . Carpal tunnel syndrome of right wrist 2012   . Septicemia due to E. coli FEB 2013   . Headache    . Wears glasses    . Vision problem    . Anemia    . Stomach acid    . Hypertensive disorder    . Arthritis        Past Surgical History:     Past Surgical History   Procedure Laterality Date   . Cyst removal  2011     LEFT HAND   . Release, dequervain's contracture  05/25/2009   . Carpal tunnel release  03/07/2011   . Knee arthroscopy  11/03/2012   . Hysterectomy     . Appendectomy     . Bladder tac  2003       Family History:     Family History   Problem Relation Age of Onset   . Heart attack Mother    . Vision loss Mother    . Diabetes Mother    . Heart attack Father    . Heart attack Other    . Ulcerative colitis Other    . Vision loss Other    . Cancer Brother        Social  History:     History     Social History   . Marital Status: Married     Spouse Name: N/A     Number of Children: N/A   . Years of Education: N/A     Occupational History   . Not on file.     Social History Main Topics   . Smoking status: Never Smoker    . Smokeless tobacco: Never Used   . Alcohol Use: No   . Drug Use: No   . Sexual Activity: Not on file     Other Topics Concern   . Not on file     Social History Narrative       Allergies:     Allergies   Allergen Reactions   . Hydrocodone Rash       Medications:     Prior to Admission medications    Medication Sig Start Date End Date Taking? Authorizing Provider   amitriptyline (  ELAVIL) 50 MG tablet Take 50 mg by mouth nightly.    [provider]   omeprazole (PRILOSEC) 40 MG capsule Take 40 mg by mouth daily.    [provider]   propranolol (INDERAL LA) 160 MG SR capsule  10/17/12   [provider]   simvastatin (ZOCOR) 40 MG tablet Take 40 mg by mouth nightly.    [provider]   triamterene-hydrochlorothiazide (MAXZIDE-25) 37.5-25 MG per tablet Take 1 tablet by mouth daily.    [provider]   hydrOXYzine (ATARAX) 25 MG tablet Take 1 tablet (25 mg total) by mouth 3 (three) times daily as needed for Itching. 05/15/14 09/17/14  Leanord Asal, PA   triamcinolone (KENALOG) 0.5 % ointment Apply topically 2 (two) times daily. APPLY TWICE DAILY AS NEEDED 05/15/14 09/17/14  Leanord Asal, PA       Review of Systems:     Review of Systems   Constitutional: Positive for chills, weight loss, malaise/fatigue and diaphoresis. Negative for fever.   HENT: Negative for congestion and sore throat.    Eyes: Negative for blurred vision and double vision.   Respiratory: Negative for cough, shortness of breath and wheezing.    Cardiovascular: Positive for leg swelling (nothing new or out of the ordinary). Negative for chest pain and palpitations.   Gastrointestinal: Positive for abdominal pain, diarrhea and blood in stool. Negative for  nausea, vomiting, constipation and melena.   Genitourinary: Negative for dysuria, urgency, frequency and hematuria.   Musculoskeletal: Negative for back pain.   Skin: Negative for itching and rash.   Neurological: Negative for weakness and headaches.          Physical Exam:   Patient Vitals for the past 24 hrs:   BP Temp Pulse Resp SpO2   09/17/14 1227 122/89 mmHg 97.3 F (36.3 C) 77 20 96 %     There is no weight on file to calculate BMI.    Intake/Output Summary (Last 24 hours) at 09/17/14 1357  Last data filed at 09/17/14 1344   Gross per 24 hour   Intake      0 ml   Output    300 ml   Net   -300 ml       Physical Exam   Constitutional: She is oriented to person, place, and time. She appears well-developed and well-nourished. No distress.   HENT:   Head: Normocephalic and atraumatic.   Mouth/Throat: Oropharynx is clear and moist. No oropharyngeal exudate.   Eyes: Conjunctivae and EOM are normal. Pupils are equal, round, and reactive to light. Right eye exhibits no discharge. Left eye exhibits no discharge. No scleral icterus.   Neck: Normal range of motion. Neck supple.   Cardiovascular: Normal rate, normal heart sounds and intact distal pulses.  An irregular rhythm present. Exam reveals no gallop and no friction rub.    No murmur heard.  Pulmonary/Chest: Effort normal and breath sounds normal.   Abdominal: Soft. She exhibits distension (mild). Bowel sounds are increased. There is tenderness (generalized).   Musculoskeletal: Normal range of motion. She exhibits edema (non pitting, equal b/l). She exhibits no tenderness.   Lymphadenopathy:     She has no cervical adenopathy.   Neurological: She is alert and oriented to person, place, and time.   Skin: Skin is warm and dry. No rash noted. She is not diaphoretic. No erythema.   Psychiatric: She has a normal mood and affect. Her behavior is normal. Judgment and thought content  normal.   Vitals reviewed.      Osteopathic exam- patient was examined in the upright and  supine position.  There was no evidence of lordosis, kyphosis or scoliosis.  No grossly tender lesions.  No gross decrease in range of motion.  lower thoracics are warm      Labs:     Results     Procedure Component Value Units Date/Time    UA, Reflex to Microscopic [[id number]]  (Abnormal) Collected:  09/17/14 1211    Specimen Information:  Urine, Random Updated:  09/17/14 1357     Color, UA Yellow      Clarity, UA Clear      Specific Gravity, UR 1.004      pH, Urine 7.0 pH      Protein, UR Negative mg/dL      Glucose, UA Negative mg/dL      Ketones UA Negative      Bilirubin, UA Negative mg/dL      Blood, UA Negative mg/dL      Nitrite, UA Negative      Urobilinogen, UA 0.2 mg/dL      Leukocyte Esterase, UA Trace (A) Leu/uL      UR Micro Performed      WBC, UA 1-2 /hpf      RBC, UA 1-2 /hpf      Bacteria, UA Rare (A) /hpf      Squam Epithel, UA 1-5 /lpf     Magnesium [[id number]] Collected:  09/17/14 1213    Specimen Information:  Plasma Updated:  09/17/14 1345     Magnesium 2.1 mg/dL     Phosphorus [[id number]] Collected:  09/17/14 1213    Specimen Information:  Plasma Updated:  09/17/14 1345     Phosphorus 3.6 mg/dL     Troponin I (Stat) [[id number]] Collected:  09/17/14 1213    Specimen Information:  Plasma Updated:  09/17/14 1245     Troponin I 0.01 ng/mL     CMP [[id number]]  (Abnormal) Collected:  09/17/14 1213    Specimen Information:  Plasma Updated:  09/17/14 1239     Sodium 140 mMol/L      Potassium 3.2 (L) mMol/L      Chloride 103 mMol/L      CO2 26.0 mMol/L      CALCIUM 9.4 mg/dL      Glucose 97 mg/dL      Creatinine 1.02 mg/dL      BUN 12 mg/dL      Protein, Total 6.4 gm/dL      Albumin 3.7 gm/dL      Alkaline Phosphatase 67 U/L      ALT 14 U/L      AST (SGOT) 17 U/L      Bilirubin, Total 1.0 mg/dL      Albumin/Globulin Ratio 1.33 Ratio      Anion Gap 14.2 mMol/L      BUN/Creatinine Ratio 15.4 Ratio      EGFR >60 mL/min/1.78m2      Osmolality Calc 279 mOsm/kg      Globulin 2.8 gm/dL     PT/INR [[id number]]  Collected:  09/17/14 1213    Specimen Information:  Blood Updated:  09/17/14 1232     PT 10.6 sec      PT INR 1.0     CBC [[id number]]  (Abnormal) Collected:  09/17/14 1213    Specimen Information:  Blood / Blood Updated:  09/17/14 1225     WBC 7.0 K/cmm      RBC  3.94 M/cmm      Hemoglobin 12.2 gm/dL      Hematocrit 16.1 %      MCV 94 fL      MCH 31 pg      MCHC 33 gm/dL      RDW 09.6 %      PLT CT 267 K/cmm      MPV 7.9 (L) fL      NEUTROPHIL % 69.2 %      Lymphocytes 20.2 %      Monocytes 8.6 %      Eosinophils % 1.4 %      Basophils % 0.6 %      Neutrophils Absolute 4.9 K/cmm      Lymphocytes Absolute 1.4 K/cmm      Monocytes Absolute 0.6 K/cmm      Eosinophils Absolute 0.1 K/cmm      BASO Absolute 0.0 K/cmm             Radiology:     XR CHEST AP PORTABLE   Final Result   Normal one view chest exam.      ReadingStation:[facility]      CT Abdomen Pelvis W IV And PO Cont    (Results Pending)         Imaging personally reviewed.    Assessment/Plan     Multifocal atrial tachycardia   Will admit to obs and monitor on tele   Likely this is due to dehydration, s/p 1 L NS in the ED   Will proceed with D5, half normal saline with 40 of KCl for 1 liter at 125 an hour   Repeat EKG in the am    Diarrhea   Concern for C diff   Will obtain stool sample and then treat with PO flagyl empirically     Hypokalemia   Given 10 of KCl in the ED, will replenish as above   Recheck this evening and in the am    Hypertension   Will restart home dyazide tomorrow    Migraine   Continue home amitriptyline    Continue home inderal     GERD   pepcid for home prilosec    Hyperlipidemia   Continue home statin    DVT prophylaxis   lovenox and SCDs      The assessment and plan were discussed with Dr Lacy Duverney who also saw and evaluated the patient.         ________________________  Jolinda Croak, DO-R2  Pager: [phone]  09/17/2014      UJ:[id number], [place]ou, FNP

## 2014-09-17 NOTE — ED Notes (Signed)
pATIENT NOW IN SINUS RHYTHM WITH PAC'S

## 2014-09-18 LAB — CBC AND DIFFERENTIAL
Basophils %: 0.5 % (ref 0.0–3.0)
Basophils Absolute: 0 10*3/uL (ref 0.0–0.3)
Eosinophils %: 2.8 % (ref 0.0–7.0)
Eosinophils Absolute: 0.1 10*3/uL (ref 0.0–0.8)
Hematocrit: 34.2 % — ABNORMAL LOW (ref 36.0–48.0)
Hemoglobin: 11.1 gm/dL — ABNORMAL LOW (ref 12.0–16.0)
Lymphocytes Absolute: 1.3 10*3/uL (ref 0.6–5.1)
Lymphocytes: 27.5 % (ref 15.0–46.0)
MCH: 31 pg (ref 28–35)
MCHC: 33 gm/dL (ref 33–37)
MCV: 94 fL (ref 80–100)
MPV: 7.8 fL — ABNORMAL LOW (ref 8.0–12.0)
Monocytes Absolute: 0.6 10*3/uL (ref 0.1–1.7)
Monocytes: 13.2 % (ref 3.0–15.0)
Neutrophils %: 56 % (ref 42.0–78.0)
Neutrophils Absolute: 2.6 10*3/uL (ref 1.7–8.6)
PLT CT: 249 10*3/uL (ref 130–440)
RBC: 3.63 10*6/uL — ABNORMAL LOW (ref 3.80–5.00)
RDW: 14.2 % (ref 12.0–15.0)
WBC: 4.7 10*3/uL (ref 4.0–11.0)

## 2014-09-18 LAB — ECG 12-LEAD
Interpretation Text: BORDERLINE
P Wave Axis: 63 deg
P Wave Duration: 134 ms
P-R Interval: 210 ms
Patient Age: 68 years
Q-T Dispersion: 50 ms
Q-T Interval(Corrected): 450 ms
Q-T Interval: 408 ms
QRS Axis: 21 deg
QRS Duration: 84 ms
T Axis: 42 deg
Ventricular Rate: 73 /min

## 2014-09-18 LAB — BASIC METABOLIC PANEL
Anion Gap: 10 mMol/L (ref 7.0–18.0)
BUN / Creatinine Ratio: 11.8 Ratio (ref 10.0–30.0)
BUN: 9 mg/dL (ref 7–22)
CO2: 28 mMol/L (ref 20.0–30.0)
Calcium: 8.7 mg/dL (ref 8.5–10.5)
Chloride: 108 mMol/L (ref 98–110)
Creatinine: 0.76 mg/dL (ref 0.60–1.20)
EGFR: >60 mL/min/{1.73_m2}
Glucose: 109 mg/dL — ABNORMAL HIGH (ref 70–99)
Osmolality Calc: 282 mOsm/kg (ref 275–300)
Potassium: 4 mMol/L (ref 3.5–5.3)
Sodium: 142 mMol/L (ref 136–147)

## 2014-09-18 MED ORDER — IBUPROFEN 400 MG PO TABS
400.0000 mg | ORAL_TABLET | Freq: Four times a day (QID) | ORAL | Status: DC | PRN
Start: 2014-09-18 — End: 2014-09-19
  Administered 2014-09-18: 400 mg via ORAL
  Filled 2014-09-18: qty 1

## 2014-09-18 MED ORDER — SODIUM CHLORIDE 0.9 % IV BOLUS
1000.0000 mL | Freq: Once | INTRAVENOUS | Status: AC
Start: 2014-09-18 — End: 2014-09-18
  Administered 2014-09-18: 1000 mL via INTRAVENOUS

## 2014-09-18 NOTE — Progress Note - Problem Oriented Charting Notewrit (Signed)
Received report from [place] RN. Patient resting in bed with callbell in reach.

## 2014-09-18 NOTE — Progress Notes (Signed)
Report received from [place], [place]. Pt sleeping in bed w/ call bell within reach and IV infusing. Will continue to monitor for needs qshift.

## 2014-09-18 NOTE — Progress Note - Problem Oriented Charting Notewrit (Signed)
Patient resting in bed with eyes closed. Call bell in reach.

## 2014-09-18 NOTE — Progress Notes (Signed)
Pt emotional and crying. She states, her grand-daughter fell off the roof and landed face down breaking her leg. She's not sure if she's going to make it or not and reports feeling helpless since she's stuck here in a hospital. I sympathized w/ pt, telling  her to be hopeful for a positive outcome.

## 2014-09-18 NOTE — UM Notes (Signed)
[facility] Utilization Management Review Sheet    NAME: [name]  MR#: [id number]    CSN#: [id number]    ROOM: 211/211-A AGE: 69 y.o.    ADMIT DATE AND TIME: 09/17/2014 11:46 AM      PATIENT CLASS: Observation    ATTENDING PHYSICIAN: Berneice Heinrich, MD  PAYOR:Payor: MEDICARE / Plan: MEDICARE PART A ONLY / Product Type: *No Product type* /       AUTH #:     DIAGNOSIS:     ICD-10-CM    1. Atrial tachycardia I47.1        HISTORY:   Past Medical History   Diagnosis Date   . Carpal tunnel syndrome of right wrist 2012   . Septicemia due to E. coli FEB 2013   . Headache    . Wears glasses    . Vision problem    . Anemia    . Stomach acid    . Hypertensive disorder    . Arthritis        DATE OF REVIEW: 09/18/2014  09/17/14 ER CC Irregular heartbeat  Referred from PCPs office, found to be in irregular rhythm (MAT) on EKG.  Diarrhea x 3 weeks, initially treated with Cipro and was better until 3-4 days ago. 6-8 loose BMs daily, abd discomfort, chills and diaphoresis with BMs, blood (from hemorrhoids) in the diarrhea, and a 17 pound weight loss. Decreased po intake  K+ 3.2  CT abd        Mild thickening of the sigmoid colon and cecum may suggests colitis. No evidence of diverticulitis.         A/P  Multifocal atrial tachycardia  Will admit to obs and monitor on tele  Likely this is due to dehydration, s/p 1 L NS in the ED  Will proceed with D5, half normal saline with 40 of KCl for 1 liter at 125 an hour  Repeat EKG in the am    Diarrhea  Concern for C diff  Will obtain stool sample and then treat with PO flagyl empirically    Hypokalemia  Given 10 of KCl in the ED, will replenish as above  Recheck this evening and in the am    IVF at 125/h, flagyl po q8h, regular diet  Tele- NSR  VITALS: BP 119/43 mmHg  Pulse 96  Temp(Src) 99.3 F (37.4 C) (Tympanic)  Resp 18  Ht 1.6 m (5\' 3" )  Wt 70 kg (154 lb 5.2 oz)  BMI 27.34 kg/m2  SpO2 97%

## 2014-09-18 NOTE — Plan of Care (Signed)
Goals are progressing towards discharge. Patient is free of injury and falls at this ti

## 2014-09-18 NOTE — Progress Notes (Signed)
PROGRESS NOTE - Resident    Date Time: 09/18/2014 7:35 AM  Patient Name: [name]  Attending Physician: Hannah Heinrich, MD  Resident: Hannah Gander, DO  Primary Care Physician: Hannah Crane, FNP  Treatment Team:   Attending Provider: Berneice Heinrich, MD  Resident: Hannah Gander, DO  Code Status: full code      Assessment/Plan                                                                                   Multifocal atrial tachycardia   Will admit to obs and monitor on tele   Likely this is due to dehydration, s/p 1 L NS in the ED   Repeat EKG shows NSR with first degree AV block    Diarrhea   Positive for C diff   Treating with PO flagyl and hydration    Hypokalemia-resolved   Given a total of 50 meq of KCl through the night   4.0 this am    Hypertension   Mildly hypotensive this morning, will bolus and monitor   Will continue to hold home dyazide     Migraine   Continue home amitriptyline    Continue home inderal     GERD   pepcid for home prilosec    Hyperlipidemia   Continue home statin    DVT prophylaxis   lovenox and SCDs    Nutrition: Regular Diet      Subjective                                                                                                CC: Multifocal atrial tachycardia    Subjective: This morning the patient is seen sitting up in bed. She has continued to have diarrhea but she is eating and drinking. She denies having any CP, SOB, fever, chills, n/v.         HPI: [name] is a 69 y.o. female who presents to the hospital from her PCP's office with about 3 weeks of diarrhea, she was found to be in an irregular rhythm on EKG. The diarrhea was treated initially with cipro and was better until she finished the cipro and then over the past 30-4 days has worsened again. She is now having 6-8 loose bowel movements daily. Associated with chills, generalized abdominal discomfort, sweating with bowel movements, blood (from hemorrhoids) in the diarrhea,  and a 17 pound weight loss. She has decreased her PO intake because eating and drinking seems to induce diarrhea. She was diagnosed with MAT on EKG, consistent with findings in the ED. Treatment for hypokalemia was also initiated in the ED.     Work up for incidental PACs 3 years ago was negative.       Physical Exam:  Temp:  [97.3 F (36.3 C)-99.3 F (37.4 C)] 99.3 F (37.4 C)  Heart Rate:  [65-81] 70  Resp Rate:  [14-27] 18  BP: (90-136)/(52-89) 90/52 mmHg    Intake/Output Summary (Last 24 hours) at 09/18/14 0735  Last data filed at 09/17/14 1916   Gross per 24 hour   Intake    240 ml   Output    300 ml   Net    -60 ml     Weight change:   Wt Readings from Last 3 Encounters:   09/17/14 70 kg (154 lb 5.2 oz)   05/15/14 74.844 kg (165 lb)   12/25/12 77.701 kg (171 lb 4.8 oz)     Body mass index is 27.34 kg/(m^2).     General: awake, alert, oriented x 3; no acute distress.  HEENT: anicteric, EOMI, conjunctiva clear  Cardiovascular: regular rate and rhythm, no murmurs, rubs or gallops  Lungs: clear to auscultation bilaterally, without wheezing, rhonchi, or rales  Abdomen: soft, non-tender, mildly distended; no palpable masses, no hepatosplenomegaly, hyperactive bowel sounds, no rebound or guarding  Extremities: no clubbing or cyanosis. Mild, nonpitting lower extremity edema, +2 pedal pulses bilaterally   Skin: No rashes  Neuro: no focal neurlogical deficits      Meds:                                                                                                        Medications were reviewed in the electronic record.    Current Facility-Administered Medications   Medication Dose Route Frequency   . amitriptyline  50 mg Oral QHS   . enoxaparin  40 mg Subcutaneous Q24H   . famotidine  20 mg Oral BID   . hydrocortisone  25 mg Rectal BID   . metroNIDAZOLE  500 mg Oral Q8H   . pravastatin  40 mg Oral QHS   . propranolol  160 mg Oral  Daily       . dextrose 5 % and 0.45 % NaCl with KCl 40 mEq 125 mL/hr at 09/18/14 0036         Labs and Imaging:                                                                                   Labs (last 72 hours):      Recent Labs  Lab 09/18/14  0609 09/17/14  1213   WBC 4.7 7.0   HEMOGLOBIN 11.1* 12.2   HEMATOCRIT 34.2* 36.8   PLATELETS 249 267         Recent Labs  Lab 09/17/14  1213   PT 10.6   PT INR 1.0      Recent Labs  Lab 09/18/14  0609 09/17/14  1803 09/17/14  1213   SODIUM 142 141 140   POTASSIUM 4.0 3.3* 3.2*   CHLORIDE 108 108 103   CO2 28.0 26.0 26.0   BUN 9 9 12    CREATININE 0.76 0.74 0.78   CALCIUM 8.7 8.7 9.4   ALBUMIN  --   --  3.7   PROTEIN, TOTAL  --   --  6.4   BILIRUBIN, TOTAL  --   --  1.0   ALKALINE PHOSPHATASE  --   --  67   ALT  --   --  14   AST (SGOT)  --   --  17   GLUCOSE 109* 118* 97                 Microbiology Results     Procedure Component Value Units Date/Time    C diff Toxin B [[id number]]  (Abnormal) Collected:  09/17/14 1448    Specimen Information:  Stool Updated:  09/17/14 2003     C.difficile Toxin B gene by DNA Amplification        Result:        ** Positive for Toxigenic C. difficile ** (AA)     Comment: ** Order Contact Special Precautions Required **  Results called and read back by (licensed clinician/date/time/tech): Hoyt Koch MT 09-17-14 2003 18  The above 1 analytes were performed by [facility] [phone] [place] [address]         Stool Culture [[id number]] Collected:  09/17/14 1448    Specimen Information:  Stool Updated:  09/17/14 1821          Xr Chest Ap Portable    09/17/2014   Normal one view chest exam.  ReadingStation:[facility]    Ct Abdomen Pelvis W Iv And Po Cont    09/17/2014   Mild thickening of the sigmoid colon and cecum may suggests colitis. No evidence of diverticulitis.  ReadingStation:[facility]        Assessment and plan discussed with attending, Dr Liliane Shi, who also saw and evaluated the  patient.    _________________________________  Hannah Gander, DO  R2, pager [phone]  09/18/2014

## 2014-09-19 LAB — CBC AND DIFFERENTIAL
Basophils %: 0.8 % (ref 0.0–3.0)
Basophils Absolute: 0 10*3/uL (ref 0.0–0.3)
Eosinophils %: 3.1 % (ref 0.0–7.0)
Eosinophils Absolute: 0.1 10*3/uL (ref 0.0–0.8)
Hematocrit: 33.8 % — ABNORMAL LOW (ref 36.0–48.0)
Hemoglobin: 11 gm/dL — ABNORMAL LOW (ref 12.0–16.0)
Lymphocytes Absolute: 1.4 10*3/uL (ref 0.6–5.1)
Lymphocytes: 40.6 % (ref 15.0–46.0)
MCH: 31 pg (ref 28–35)
MCHC: 33 gm/dL (ref 33–37)
MCV: 95 fL (ref 80–100)
MPV: 8.1 fL (ref 8.0–12.0)
Monocytes Absolute: 0.5 10*3/uL (ref 0.1–1.7)
Monocytes: 14.3 % (ref 3.0–15.0)
Neutrophils %: 41.2 % — ABNORMAL LOW (ref 42.0–78.0)
Neutrophils Absolute: 1.4 10*3/uL — ABNORMAL LOW (ref 1.7–8.6)
PLT CT: 205 10*3/uL (ref 130–440)
RBC: 3.55 10*6/uL — ABNORMAL LOW (ref 3.80–5.00)
RDW: 13.8 % (ref 12.0–15.0)
WBC: 3.4 10*3/uL — ABNORMAL LOW (ref 4.0–11.0)

## 2014-09-19 LAB — BASIC METABOLIC PANEL
Anion Gap: 9.8 mMol/L (ref 7.0–18.0)
BUN / Creatinine Ratio: 12.3 Ratio (ref 10.0–30.0)
BUN: 9 mg/dL (ref 7–22)
CO2: 26 mMol/L (ref 20.0–30.0)
Calcium: 9 mg/dL (ref 8.5–10.5)
Chloride: 111 mMol/L — ABNORMAL HIGH (ref 98–110)
Creatinine: 0.73 mg/dL (ref 0.60–1.20)
EGFR: >60 mL/min/{1.73_m2}
Glucose: 103 mg/dL — ABNORMAL HIGH (ref 70–99)
Osmolality Calc: 282 mOsm/kg (ref 275–300)
Potassium: 4.8 mMol/L (ref 3.5–5.3)
Sodium: 142 mMol/L (ref 136–147)

## 2014-09-19 LAB — TSH: TSH: 1.95 u[IU]/mL (ref 0.40–4.20)

## 2014-09-19 MED ORDER — HYDROCORTISONE ACETATE 25 MG RE SUPP
25.0000 mg | Freq: Two times a day (BID) | RECTAL | Status: AC
Start: 2014-09-19 — End: ?

## 2014-09-19 MED ORDER — METRONIDAZOLE 500 MG PO TABS
500.0000 mg | ORAL_TABLET | Freq: Three times a day (TID) | ORAL | Status: DC
Start: 2014-09-19 — End: 2014-09-24

## 2014-09-19 MED ORDER — LACTATED RINGERS IV SOLN
INTRAVENOUS | Status: DC
Start: 2014-09-19 — End: 2014-09-19

## 2014-09-19 NOTE — Discharge Summary (Signed)
Discharge Summary    Date:09/19/2014   Patient Name: [facility]  Attending Physician: Berneice Heinrich, MD    Date of Admission:   09/17/2014    Date of Discharge:   09/19/2014     Admitting Diagnosis:     Dehydration due to diarrhea inducing MAT    Discharge Dx:     Principal Diagnosis (Diagnosis after study, that is chiefly responsible for admission to inpatient status): C difficile     Active Hospital Problems    Diagnosis POA   . Principal Problem: Multifocal atrial tachycardia Yes   . Diarrhea Yes   . Hypertension Yes   . Pure hypercholesterolemia Yes      Resolved Hospital Problems    Diagnosis POA   . Dehydration Yes   . Hypokalemia Yes       Treatment Team:   Treatment Team:   Attending Provider: Berneice Heinrich, MD  Resident: Tama Gander, DO     Procedures performed:   Radiology: all results from this admission  Xr Chest Ap Portable    09/17/2014   Normal one view chest exam.  ReadingStation:[email or web address]    Ct Abdomen Pelvis W Iv And Po Cont    09/17/2014   Mild thickening of the sigmoid colon and cecum may suggests colitis. No evidence of diverticulitis.  ReadingStation:[email or web address]      Reason for Admission:     [name] is a 69 y.o. female who presents to the hospital from her PCP's office with about 3 weeks of diarrhea, she was found to be in an irregular rhythm on EKG. The diarrhea was treated initially with cipro and was better until she finished the cipro and then over the past 30-4 days has worsened again. She is now having 6-8 loose bowel movements daily. Associated with chills, generalized abdominal discomfort, sweating with bowel movements, blood (from hemorrhoids) in the diarrhea, and a 17 pound weight loss. She has decreased her PO intake because eating and drinking seems to induce diarrhea. She was diagnosed with MAT on EKG, consistent with findings in the ED. Treatment for hypokalemia was also initiated in the ED.     Work up for incidental PACs 3 years ago was negative.      Hospital Course:     Mrs Overall was admitted to the floor and monitored on tele as we rehydrated her. The MAT resolved and the following morning her EKG showed first degree AV block. Stool studies came back with c diff and she was started on PO Flagyl. Her hypokalemia resolved with replacement. She did have a run of Bigeminy the second night, associated with some tachycardia, all of which was at rest and self resolved. EKG was repeated prior to d/c and she was set up for an outpatient echo and holter monitor and cardiology follow up through our office.     Condition at Discharge:     stable    Today:     BP 151/62 mmHg  Pulse 36  Temp(Src) 97.3 F (36.3 C) (Oral)  Resp 17  Ht 1.6 m (5\' 3" )  Wt 70 kg (154 lb 5.2 oz)  BMI 27.34 kg/m2  SpO2 98%  Ranges for the last 24 hours:  Temp:  [97.3 F (36.3 C)-99.2 F (37.3 C)] 97.3 F (36.3 C)  Heart Rate:  [36-96] 36  Resp Rate:  [16-18] 17  BP: (112-151)/(43-81) 151/62 mmHg    Last set of labs     Recent Labs  Lab 09/19/14  [phone]  WBC 3.4*   HEMOGLOBIN 11.0*   HEMATOCRIT 33.8*   PLATELETS 205           Invalid input(s): FREET4    Recent Labs  Lab 09/17/14  1213   TROPONIN I 0.01     Microbiology Results     Procedure Component Value Units Date/Time    C diff Toxin B [[id number]]  (Abnormal) Collected:  09/17/14 1448    Specimen Information:  Stool Updated:  09/17/14 2003     C.difficile Toxin B gene by DNA Amplification        Result:        ** Positive for Toxigenic C. difficile ** (AA)     Comment: ** Order Contact Special Precautions Required **  Results called and read back by (licensed clinician/date/time/tech): Hoyt Koch MT 09-17-14 2003 18  The above 1 analytes were performed by [facility] [phone] [place] [id number]         Stool Culture [[id number]] Collected:  09/17/14 1448    Specimen Information:  Stool Updated:  09/18/14 0905    Narrative:      Specimen: Stool  Collected: 09/17/2014 14:48     Status:  Valued      Last Updated: 09/18/2014 09:05                Culture Result (Prelim)      No Growth - Day 1, Reincubate                  Micro / Labs / Path pending:     Unresulted Labs     Procedure . . . Date/Time    TSH [[id number]] Collected:  09/19/14 0600    Specimen Information:  Plasma Updated:  09/19/14 0823            Discharge Instructions:     Follow-up Information     Follow up with Gershon Crane, FNP In 1 week.    Specialty:  Family Nurse Practitioner    Why:  or Dr Rica Mast information:    [address]  [place] [address]  [phone]            Discharge Diet: BRAT diet     Activity/Weight bearing status: as toelrated    Disposition:  Home or Self Care        Discharge Medication List      Taking          amitriptyline 50 MG tablet   Dose:  50 mg   Commonly known as:  ELAVIL   Take 50 mg by mouth nightly.       hydrocortisone 25 MG suppository   Dose:  25 mg   Commonly known as:  ANUSOL-HC   Place 1 suppository (25 mg total) rectally 2 (two) times daily.       metroNIDAZOLE 500 MG tablet   Dose:  500 mg   Commonly known as:  FLAGYL   Take 1 tablet (500 mg total) by mouth every 8 (eight) hours.       omeprazole 40 MG capsule   Dose:  40 mg   Commonly known as:  PriLOSEC   Take 40 mg by mouth daily.       propranolol 160 MG SR capsule   Commonly known as:  INDERAL LA   - daily.     -        simvastatin 40 MG tablet  Dose:  40 mg   Commonly known as:  ZOCOR   Take 40 mg by mouth nightly.       triamterene-hydrochlorothiazide 37.5-25 MG per tablet   Dose:  1 tablet   Commonly known as:  MAXZIDE-25   Take 1 tablet by mouth daily.           Minutes spent coordinating discharge and reviewing discharge plan: 30 minutes      Signed by: Tama Gander, DO        Pt seen and examined by me. Agree with resident findings, A/P. I think d/c at this time on flagyl reasonable.  I'm not sure dyazide best choice for her long term, but ok for now.  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

## 2014-09-19 NOTE — Progress Notes (Signed)
Report received from [place], [place]. Pt resting in bed w/ call bell within reach and IV fluids infusing. Denies having any needs at this time. Will continue to monitor for needs qshift.

## 2014-09-19 NOTE — Progress Notes (Signed)
Pt received discharge teaching on new meds, their administration and side effects, s/s to report to PCP, F/U appts, diet, activity andreviewing medical records online. Verbalized understanding. Refused assistance off unit and left w/ spouse.

## 2014-09-19 NOTE — Plan of Care (Signed)
Plan of care reviewed by attending physician.  Pt status adequate for discharge home.

## 2014-09-19 NOTE — Progress Notes (Signed)
PROGRESS NOTE - Resident    Date Time: 09/19/2014 8:01 AM  Patient Name: [name]  Attending Physician: Berneice Heinrich, MD  Resident: Tama Gander, DO  Primary Care Physician: Gershon Crane, FNP  Treatment Team:   Attending Provider: Berneice Heinrich, MD  Resident: Tama Gander, DO  Code Status: full code      Assessment/Plan                                                                                   Multifocal atrial tachycardia   Will admit to obs and monitor on tele   Likely this is due to dehydration, initially resolved with rehydration   Repeat EKG shows NSR with first degree AV block   Last night she had several runs of tachycardia to the 150's, non sustained. Will get an EKG this am.    Will arrange for a 24 hour holter monitor and an echo later this week.   Added on a TSH    Diarrhea   Positive for C diff   Treating with PO flagyl and hydration    Hypokalemia-resolved   Given a total of 50 meq of KCl the evening of admission   4.8 this am   Mag and Phos WNL on admission    Hypertension   Mildly hypotensive this morning, will bolus and monitor   Will continue to hold home dyazide     Migraine   Continue home amitriptyline    Continue home inderal     GERD   pepcid for home prilosec    Hyperlipidemia   Continue home statin    DVT prophylaxis   lovenox and SCDs    Nutrition: Regular Diet      Subjective                                                                                                CC: Multifocal atrial tachycardia    Subjective: This morning the patient is seen sitting up in bed eating breakfast. She would like to go home today and feels safe doing so. No bm since yesterday. She denies having any CP, SOB, fever, chills, n/v, abdominal pain.      HPI: [name] is a 69 y.o. female who presents to the hospital from her PCP's office with about 3 weeks of diarrhea, she was found to be in an irregular rhythm on EKG. The diarrhea was treated  initially with cipro and was better until she finished the cipro and then over the past 30-4 days has worsened again. She is now having 6-8 loose bowel movements daily. Associated with chills, generalized abdominal discomfort, sweating with bowel movements, blood (from hemorrhoids) in the diarrhea, and a 17 pound weight loss. She has decreased her PO intake  because eating and drinking seems to induce diarrhea. She was diagnosed with MAT on EKG, consistent with findings in the ED. Treatment for hypokalemia was also initiated in the ED.     Work up for incidental PACs 3 years ago was negative.       Physical Exam:                                                                                        Temp:  [97.3 F (36.3 C)-99.2 F (37.3 C)] 97.3 F (36.3 C)  Heart Rate:  [36-96] 36  Resp Rate:  [16-18] 17  BP: (112-151)/(43-81) 151/62 mmHg    Intake/Output Summary (Last 24 hours) at 09/19/14 0801  Last data filed at 09/19/14 0226   Gross per 24 hour   Intake   2600 ml   Output      0 ml   Net   2600 ml     Weight change:   Wt Readings from Last 3 Encounters:   09/17/14 70 kg (154 lb 5.2 oz)   05/15/14 74.844 kg (165 lb)   12/25/12 77.701 kg (171 lb 4.8 oz)     Body mass index is 27.34 kg/(m^2).     General: awake, alert, oriented x 3; no acute distress.  HEENT: anicteric, EOMI, conjunctiva clear  Cardiovascular: regular rate and rhythm, no murmurs, rubs or gallops  Lungs: clear to auscultation bilaterally, without wheezing, rhonchi, or rales  Abdomen: soft, non-tender, mildly distended; no palpable masses, no hepatosplenomegaly, normoactive bowel sounds, no rebound or guarding  Extremities: no clubbing or cyanosis. Mild, nonpitting lower extremity edema, +2 pedal pulses bilaterally   Skin: No rashes  Neuro: no focal neurlogical deficits       Meds:                                                                                                        Medications were reviewed in the electronic record.    Current  Facility-Administered Medications   Medication Dose Route Frequency   . amitriptyline  50 mg Oral QHS   . enoxaparin  40 mg Subcutaneous Q24H   . famotidine  20 mg Oral BID   . hydrocortisone  25 mg Rectal BID   . metroNIDAZOLE  500 mg Oral Q8H   . pravastatin  40 mg Oral QHS   . propranolol  160 mg Oral Daily       . lactated ringers 125 mL/hr at 09/19/14 0432         Labs and Imaging:  Labs (last 72 hours):      Recent Labs  Lab 09/19/14  0609 09/18/14  0609   WBC 3.4* 4.7   HEMOGLOBIN 11.0* 11.1*   HEMATOCRIT 33.8* 34.2*   PLATELETS 205 249         Recent Labs  Lab 09/17/14  1213   PT 10.6   PT INR 1.0      Recent Labs  Lab 09/19/14  0609 09/18/14  0609  09/17/14  1213   SODIUM 142 142 More results in Results Review 140   POTASSIUM 4.8 4.0 More results in Results Review 3.2*   CHLORIDE 111* 108 More results in Results Review 103   CO2 26.0 28.0 More results in Results Review 26.0   BUN 9 9 More results in Results Review 12   CREATININE 0.73 0.76 More results in Results Review 0.78   CALCIUM 9.0 8.7 More results in Results Review 9.4   ALBUMIN  --   --   --  3.7   PROTEIN, TOTAL  --   --   --  6.4   BILIRUBIN, TOTAL  --   --   --  1.0   ALKALINE PHOSPHATASE  --   --   --  67   ALT  --   --   --  14   AST (SGOT)  --   --   --  17   GLUCOSE 103* 109* More results in Results Review 97   More results in Results Review = values in this interval not displayed.              Microbiology Results     Procedure Component Value Units Date/Time    C diff Toxin B [[id number]]  (Abnormal) Collected:  09/17/14 1448    Specimen Information:  Stool Updated:  09/17/14 2003     C.difficile Toxin B gene by DNA Amplification        Result:        ** Positive for Toxigenic C. difficile ** (AA)     Comment: ** Order Contact Special Precautions Required **  Results called and read back by (licensed clinician/date/time/tech): Hoyt Koch MT 09-17-14 2003  18  The above 1 analytes were performed by [facility] Lab [phone] [place] [id number]         Stool Culture [[id number]] Collected:  09/17/14 1448    Specimen Information:  Stool Updated:  09/17/14 1821          Xr Chest Ap Portable    09/17/2014   Normal one view chest exam.  ReadingStation:[email or web address]    Ct Abdomen Pelvis W Iv And Po Cont    09/17/2014   Mild thickening of the sigmoid colon and cecum may suggests colitis. No evidence of diverticulitis.  ReadingStation:[email or web address]        Assessment and plan discussed with attending, Dr Liliane Shi, who also saw and evaluated the patient.    _________________________________  Tama Gander, DO  R2, pager [phone]  09/19/2014

## 2014-09-19 NOTE — Discharge Instr - Diet (Signed)
BRAT diet

## 2014-09-19 NOTE — Discharge Instr - Activity (Signed)
As tolerated

## 2014-09-21 ENCOUNTER — Other Ambulatory Visit: Payer: Self-pay | Admitting: Family Medicine

## 2014-09-21 ENCOUNTER — Other Ambulatory Visit (INDEPENDENT_AMBULATORY_CARE_PROVIDER_SITE_OTHER): Payer: Self-pay | Admitting: Family Medicine

## 2014-09-21 DIAGNOSIS — I471 Supraventricular tachycardia: Secondary | ICD-10-CM

## 2014-09-23 ENCOUNTER — Emergency Department: Payer: BC Managed Care – PPO

## 2014-09-23 ENCOUNTER — Observation Stay: Payer: BC Managed Care – PPO | Admitting: Family Medicine

## 2014-09-23 ENCOUNTER — Encounter: Payer: Self-pay | Admitting: Family Medicine

## 2014-09-23 ENCOUNTER — Observation Stay
Admission: EM | Admit: 2014-09-23 | Discharge: 2014-09-24 | Disposition: A | Discharge status: Home / Self Care | Payer: BC Managed Care – PPO | Attending: Family Medicine | Admitting: Family Medicine

## 2014-09-23 DIAGNOSIS — I10 Essential (primary) hypertension: Secondary | ICD-10-CM | POA: Insufficient documentation

## 2014-09-23 DIAGNOSIS — K219 Gastro-esophageal reflux disease without esophagitis: Secondary | ICD-10-CM | POA: Insufficient documentation

## 2014-09-23 DIAGNOSIS — Z9049 Acquired absence of other specified parts of digestive tract: Secondary | ICD-10-CM | POA: Insufficient documentation

## 2014-09-23 DIAGNOSIS — R531 Weakness: Secondary | ICD-10-CM

## 2014-09-23 DIAGNOSIS — D649 Anemia, unspecified: Secondary | ICD-10-CM | POA: Insufficient documentation

## 2014-09-23 DIAGNOSIS — A047 Enterocolitis due to Clostridium difficile: Principal | ICD-10-CM | POA: Insufficient documentation

## 2014-09-23 DIAGNOSIS — Z809 Family history of malignant neoplasm, unspecified: Secondary | ICD-10-CM | POA: Insufficient documentation

## 2014-09-23 DIAGNOSIS — R0602 Shortness of breath: Secondary | ICD-10-CM | POA: Insufficient documentation

## 2014-09-23 DIAGNOSIS — R079 Chest pain, unspecified: Secondary | ICD-10-CM | POA: Insufficient documentation

## 2014-09-23 DIAGNOSIS — Z90710 Acquired absence of both cervix and uterus: Secondary | ICD-10-CM | POA: Insufficient documentation

## 2014-09-23 DIAGNOSIS — R197 Diarrhea, unspecified: Secondary | ICD-10-CM | POA: Diagnosis present

## 2014-09-23 DIAGNOSIS — Z8489 Family history of other specified conditions: Secondary | ICD-10-CM | POA: Insufficient documentation

## 2014-09-23 DIAGNOSIS — Z8379 Family history of other diseases of the digestive system: Secondary | ICD-10-CM | POA: Insufficient documentation

## 2014-09-23 DIAGNOSIS — I471 Supraventricular tachycardia: Secondary | ICD-10-CM | POA: Insufficient documentation

## 2014-09-23 DIAGNOSIS — Z833 Family history of diabetes mellitus: Secondary | ICD-10-CM | POA: Insufficient documentation

## 2014-09-23 DIAGNOSIS — A0472 Enterocolitis due to Clostridium difficile, not specified as recurrent: Secondary | ICD-10-CM | POA: Diagnosis present

## 2014-09-23 DIAGNOSIS — E7800 Pure hypercholesterolemia, unspecified: Secondary | ICD-10-CM | POA: Diagnosis present

## 2014-09-23 DIAGNOSIS — M1990 Unspecified osteoarthritis, unspecified site: Secondary | ICD-10-CM | POA: Insufficient documentation

## 2014-09-23 DIAGNOSIS — E860 Dehydration: Secondary | ICD-10-CM | POA: Insufficient documentation

## 2014-09-23 DIAGNOSIS — E785 Hyperlipidemia, unspecified: Secondary | ICD-10-CM | POA: Insufficient documentation

## 2014-09-23 DIAGNOSIS — G43909 Migraine, unspecified, not intractable, without status migrainosus: Secondary | ICD-10-CM | POA: Diagnosis not present

## 2014-09-23 LAB — COMPREHENSIVE METABOLIC PANEL
ALT: 26 U/L (ref 0–55)
AST (SGOT): 28 U/L (ref 10–42)
Albumin/Globulin Ratio: 1.39 Ratio (ref 0.70–1.50)
Albumin: 4.1 gm/dL (ref 3.5–5.0)
Alkaline Phosphatase: 76 U/L (ref 40–145)
Anion Gap: 13.7 mMol/L (ref 7.0–18.0)
BUN / Creatinine Ratio: 24.7 Ratio (ref 10.0–30.0)
BUN: 22 mg/dL (ref 7–22)
Bilirubin, Total: 0.3 mg/dL (ref 0.1–1.2)
CO2: 21 mMol/L (ref 20.0–30.0)
Calcium: 9.6 mg/dL (ref 8.5–10.5)
Chloride: 105 mMol/L (ref 98–110)
Creatinine: 0.89 mg/dL (ref 0.60–1.20)
EGFR: >60 mL/min/{1.73_m2}
Globulin: 2.9 gm/dL (ref 2.0–4.0)
Glucose: 103 mg/dL — ABNORMAL HIGH (ref 70–99)
Osmolality Calc: 276 mOsm/kg (ref 275–300)
Potassium: 3.7 mMol/L (ref 3.5–5.3)
Protein, Total: 7 gm/dL (ref 6.0–8.3)
Sodium: 136 mMol/L (ref 136–147)

## 2014-09-23 LAB — CBC AND DIFFERENTIAL
Basophils %: 1.2 % (ref 0.0–3.0)
Basophils Absolute: 0.1 10*3/uL (ref 0.0–0.3)
Eosinophils %: 3.1 % (ref 0.0–7.0)
Eosinophils Absolute: 0.2 10*3/uL (ref 0.0–0.8)
Hematocrit: 45.5 % (ref 36.0–48.0)
Hemoglobin: 14.9 gm/dL (ref 12.0–16.0)
Lymphocytes Absolute: 2.3 10*3/uL (ref 0.6–5.1)
Lymphocytes: 31.1 % (ref 15.0–46.0)
MCH: 31 pg (ref 28–35)
MCHC: 33 gm/dL (ref 33–37)
MCV: 94 fL (ref 80–100)
MPV: 8.6 fL (ref 8.0–12.0)
Monocytes Absolute: 1 10*3/uL (ref 0.1–1.7)
Monocytes: 14 % (ref 3.0–15.0)
Neutrophils %: 50.6 % (ref 42.0–78.0)
Neutrophils Absolute: 3.7 10*3/uL (ref 1.7–8.6)
PLT CT: 338 10*3/uL (ref 130–440)
RBC: 4.83 10*6/uL (ref 3.80–5.00)
RDW: 14.3 % (ref 12.0–15.0)
WBC: 7.3 10*3/uL (ref 4.0–11.0)

## 2014-09-23 LAB — ECG 12-LEAD
P Wave Axis: 35 deg
P Wave Duration: 106 ms
P-R Interval: 138 ms
Patient Age: 68 years
Q-T Dispersion: 42 ms
Q-T Interval(Corrected): 469 ms
Q-T Interval: 364 ms
QRS Axis: 4 deg
QRS Duration: 80 ms
T Axis: 25 deg
Ventricular Rate: 100 /min

## 2014-09-23 LAB — URINALYSIS, REFLEX TO MICROSCOPIC EXAM IF INDICATED
Bilirubin, UA: NEGATIVE mg/dL
Blood, UA: NEGATIVE mg/dL
Glucose, UA: NEGATIVE mg/dL
Ketones UA: NEGATIVE
Nitrite, UA: NEGATIVE
Protein, UR: NEGATIVE mg/dL
RBC, UA: NONE SEEN /hpf
Urine Specific Gravity: 1.008 (ref 1.001–1.040)
Urobilinogen, UA: 0.2 mg/dL
pH, Urine: 6 pH (ref 5.0–8.0)

## 2014-09-23 LAB — MAGNESIUM: Magnesium: 2.1 mg/dL (ref 1.6–2.6)

## 2014-09-23 LAB — PHOSPHORUS: Phosphorus: 4 mg/dL (ref 2.3–4.7)

## 2014-09-23 LAB — TROPONIN I
Troponin I: 0 ng/mL (ref 0.00–0.02)
Troponin I: 0.01 ng/mL (ref 0.00–0.02)

## 2014-09-23 LAB — TSH: TSH: 3.46 u[IU]/mL (ref 0.40–4.20)

## 2014-09-23 MED ORDER — SODIUM CHLORIDE 0.9 % IJ SOLN
3.0000 mL | Freq: Three times a day (TID) | INTRAMUSCULAR | Status: DC
Start: 2014-09-23 — End: 2014-09-24
  Administered 2014-09-23 – 2014-09-24 (×2): 3 mL via INTRAVENOUS

## 2014-09-23 MED ORDER — FAMOTIDINE 20 MG PO TABS
20.0000 mg | ORAL_TABLET | Freq: Every day | ORAL | Status: DC
Start: 2014-09-23 — End: 2014-09-24
  Administered 2014-09-23 – 2014-09-24 (×2): 20 mg via ORAL
  Filled 2014-09-23 (×5): qty 1

## 2014-09-23 MED ORDER — SODIUM CHLORIDE 0.9 % IV BOLUS
1000.0000 mL | Freq: Once | INTRAVENOUS | Status: AC
Start: 2014-09-23 — End: 2014-09-23
  Administered 2014-09-23: 1000 mL via INTRAVENOUS

## 2014-09-23 MED ORDER — ASPIRIN 81 MG PO CHEW
CHEWABLE_TABLET | ORAL | Status: AC
Start: 2014-09-23 — End: ?
  Filled 2014-09-23: qty 4

## 2014-09-23 MED ORDER — ASPIRIN 81 MG PO CHEW
324.0000 mg | CHEWABLE_TABLET | Freq: Once | ORAL | Status: AC
Start: 2014-09-23 — End: 2014-09-23
  Administered 2014-09-23: 324 mg via ORAL

## 2014-09-23 MED ORDER — AMITRIPTYLINE HCL 25 MG PO TABS
25.0000 mg | ORAL_TABLET | Freq: Every evening | ORAL | Status: DC
Start: 2014-09-23 — End: 2014-09-24
  Administered 2014-09-23: 25 mg via ORAL
  Filled 2014-09-23 (×3): qty 1

## 2014-09-23 MED ORDER — AMITRIPTYLINE HCL 50 MG PO TABS
50.0000 mg | ORAL_TABLET | Freq: Every evening | ORAL | Status: DC
Start: 2014-09-23 — End: 2014-09-23
  Filled 2014-09-23: qty 1

## 2014-09-23 MED ORDER — ENOXAPARIN SODIUM 40 MG/0.4ML SC SOLN
40.0000 mg | SUBCUTANEOUS | Status: DC
Start: 2014-09-23 — End: 2014-09-24
  Filled 2014-09-23 (×2): qty 0.4

## 2014-09-23 MED ORDER — TRIAMTERENE-HCTZ 37.5-25 MG PO CAPS
1.0000 | ORAL_CAPSULE | Freq: Every day | ORAL | Status: DC
Start: 2014-09-24 — End: 2014-09-24
  Administered 2014-09-24: 1 via ORAL
  Filled 2014-09-23 (×3): qty 1

## 2014-09-23 MED ORDER — RISAQUAD PO CAPS
1.0000 | ORAL_CAPSULE | Freq: Every day | ORAL | Status: DC
Start: 2014-09-24 — End: 2014-09-24
  Administered 2014-09-24: 1 via ORAL
  Filled 2014-09-23 (×2): qty 1

## 2014-09-23 MED ORDER — PROPRANOLOL HCL ER 80 MG PO CP24
160.0000 mg | ORAL_CAPSULE | Freq: Every day | ORAL | Status: DC
Start: 2014-09-24 — End: 2014-09-24
  Administered 2014-09-24: 160 mg via ORAL
  Filled 2014-09-23 (×2): qty 2

## 2014-09-23 MED ORDER — HYDROCORTISONE ACETATE 25 MG RE SUPP
25.0000 mg | Freq: Two times a day (BID) | RECTAL | Status: DC | PRN
Start: 2014-09-23 — End: 2014-09-24

## 2014-09-23 MED ORDER — SODIUM CHLORIDE 0.9 % IV SOLN
INTRAVENOUS | Status: DC
Start: 2014-09-23 — End: 2014-09-24

## 2014-09-23 MED ORDER — PRAVASTATIN SODIUM 20 MG PO TABS
40.0000 mg | ORAL_TABLET | Freq: Every evening | ORAL | Status: DC
Start: 2014-09-23 — End: 2014-09-24
  Administered 2014-09-23: 40 mg via ORAL
  Filled 2014-09-23 (×4): qty 2

## 2014-09-23 MED ORDER — VANCOMYCIN HCL 125 MG PO CAPS
125.0000 mg | ORAL_CAPSULE | Freq: Four times a day (QID) | ORAL | Status: DC
Start: 2014-09-23 — End: 2014-09-24
  Administered 2014-09-23 – 2014-09-24 (×3): 125 mg via ORAL
  Filled 2014-09-23 (×9): qty 1

## 2014-09-23 NOTE — Plan of Care (Signed)
Pt progressing towards goals.

## 2014-09-23 NOTE — ED Notes (Signed)
Admit to [address], [id number]

## 2014-09-23 NOTE — H&P (Signed)
ADMISSION HISTORY AND PHYSICAL EXAM    Date Time: 09/23/2014 6:10 PM  Patient Name: [name]  Attending Physician: Nicolasa Ducking  Primary Care Physician: Gershon Crane, FNP    CC: chest pain, weakness  Code Status: full code     History of Presenting Illness:     [name] is a 69 y.o. female who presents to the hospital with a 2 day history of Chest pressure with SOB on exertion. This is associated with generalized weakness and a worsening of her c diff diarrhea which had improved altogether on Flagyl. She describes the chest pressure as a heaviness, not actually painful. She has been dizzy and feeling "spaced out," "like I am spinning." She has also had a constant headache, not severe and not unusual for her. She was recently admitted with MAT thought to be due to dehydration from c diff diarrhea, improved with rehydration. She is scheduled for an echo and a stress test next week.       Past Medical History:     Past Medical History   Diagnosis Date   . Carpal tunnel syndrome of right wrist 2012   . Septicemia due to E. coli FEB 2013   . Headache    . Wears glasses    . Vision problem    . Anemia    . Stomach acid    . Hypertensive disorder    . Arthritis        Past Surgical History:     Past Surgical History   Procedure Laterality Date   . Cyst removal  2011     LEFT HAND   . Release, dequervain's contracture  05/25/2009   . Carpal tunnel release  03/07/2011   . Knee arthroscopy  11/03/2012   . Hysterectomy     . Appendectomy     . Bladder tac  2003       Family History:     Family History   Problem Relation Age of Onset   . Heart attack Mother    . Vision loss Mother    . Diabetes Mother    . Heart attack Father    . Heart attack Other    . Ulcerative colitis Other    . Vision loss Other    . Cancer Brother        Social History:     History     Social History   . Marital Status: Married     Spouse Name: N/A     Number of Children: N/A   . Years of Education: N/A      Occupational History   . Not on file.     Social History Main Topics   . Smoking status: Never Smoker    . Smokeless tobacco: Never Used   . Alcohol Use: No   . Drug Use: No   . Sexual Activity: Not on file     Other Topics Concern   . Not on file     Social History Narrative       Allergies:     Allergies   Allergen Reactions   . Hydrocodone Rash       Medications:     Prior to Admission medications    Medication Sig Start Date End Date Taking? Authorizing Provider   metroNIDAZOLE (FLAGYL) 500 MG tablet Take 1 tablet (500 mg total) by mouth every 8 (eight) hours. 09/19/14 10/01/14 Yes Tama Gander, DO   amitriptyline Caleb Popp)  50 MG tablet Take 50 mg by mouth nightly.    [provider]   famotidine (PEPCID) 40 MG tablet  09/21/14   [provider]   hydrocortisone (ANUSOL-HC) 25 MG suppository Place 1 suppository (25 mg total) rectally 2 (two) times daily. 09/19/14   Tama Gander, DO   propranolol (INDERAL LA) 160 MG SR capsule daily.    10/17/12   [provider]   simvastatin (ZOCOR) 40 MG tablet Take 40 mg by mouth nightly.    [provider]   triamterene-hydrochlorothiazide (MAXZIDE-25) 37.5-25 MG per tablet Take 1 tablet by mouth daily.    [provider]   omeprazole (PRILOSEC) 40 MG capsule Take 40 mg by mouth daily.  09/23/14  [provider]       Review of Systems:     Review of Systems   Constitutional: Positive for malaise/fatigue. Negative for fever, chills and diaphoresis.   HENT: Negative for congestion and sore throat.    Eyes: Negative for blurred vision and double vision.   Respiratory: Positive for shortness of breath (with exertion). Negative for cough and wheezing.    Gastrointestinal: Positive for diarrhea. Negative for nausea, vomiting, abdominal pain, blood in stool and melena.   Genitourinary: Negative for dysuria, urgency, frequency and hematuria.   Musculoskeletal: Negative for back pain.   Skin: Negative for rash.    Neurological: Positive for dizziness, weakness and headaches (daily, not new).          Physical Exam:   Patient Vitals for the past 24 hrs:   BP Temp Pulse Resp SpO2 Height Weight   09/23/14 1451 145/81 mmHg 97.3 F (36.3 C) (!) 52 18 96 % 1.6 m (5\' 3" ) 69.854 kg (154 lb)     Body mass index is 27.29 kg/(m^2).  No intake or output data in the 24 hours ending 09/23/14 1810    Physical Exam   Constitutional: She is oriented to person, place, and time. She appears well-developed and well-nourished. No distress.   HENT:   Head: Normocephalic and atraumatic.   Mouth/Throat: Oropharynx is clear and moist. No oropharyngeal exudate.   Eyes: Conjunctivae and EOM are normal. Pupils are equal, round, and reactive to light. Right eye exhibits no discharge. Left eye exhibits no discharge.   Neck: Normal range of motion. Neck supple.   Cardiovascular: Normal rate, regular rhythm and intact distal pulses.  Exam reveals no gallop and no friction rub.    No murmur heard.  Pulmonary/Chest: Effort normal and breath sounds normal. No respiratory distress. She has no wheezes. She has no rales.   Abdominal: Soft. Bowel sounds are normal. She exhibits no distension. There is no tenderness. There is no rebound and no guarding.   Musculoskeletal: Normal range of motion. She exhibits edema (trace, b/l, stable since last week).   Lymphadenopathy:     She has no cervical adenopathy.   Neurological: She is alert and oriented to person, place, and time. No cranial nerve deficit.   Skin: Skin is warm and dry. No rash noted. No erythema.   Vitals reviewed.      Osteopathic exam- patient was examined in the upright and supine position.  There was no evidence of lordosis, kyphosis or scoliosis.  No grossly tender lesions.  No gross decrease in range of motion.  TART throughout abdomen, T 7-9      Labs:     Results     Procedure Component Value Units Date/Time    TSH [  [id number]] Collected:  09/23/14 1519    Specimen Information:  Plasma Updated:   09/23/14 1624     Thyroid Stimulating Hormone 3.46 uIU/mL     Troponin I (Stat Q4H X3) [[id number]] Collected:  09/23/14 1519    Specimen Information:  Plasma Updated:  09/23/14 1612     Troponin I 0.00 ng/mL     CMP [[id number]]  (Abnormal) Collected:  09/23/14 1519    Specimen Information:  Plasma Updated:  09/23/14 1607     Sodium 136 mMol/L      Potassium 3.7 mMol/L      Chloride 105 mMol/L      CO2 21.0 mMol/L      CALCIUM 9.6 mg/dL      Glucose 086 (H) mg/dL      Creatinine 5.78 mg/dL      BUN 22 mg/dL      Protein, Total 7.0 gm/dL      Albumin 4.1 gm/dL      Alkaline Phosphatase 76 U/L      ALT 26 U/L      AST (SGOT) 28 U/L      Bilirubin, Total 0.3 mg/dL      Albumin/Globulin Ratio 1.39 Ratio      Anion Gap 13.7 mMol/L      BUN/Creatinine Ratio 24.7 Ratio      EGFR >60 mL/min/1.29m2      Osmolality Calc 276 mOsm/kg      Globulin 2.9 gm/dL     CBC [[id number]] Collected:  09/23/14 1519    Specimen Information:  Blood / Blood Updated:  09/23/14 1557     WBC 7.3 K/cmm      RBC 4.83 M/cmm      Hemoglobin 14.9 gm/dL      Hematocrit 41.3 %      MCV 94 fL      MCH 31 pg      MCHC 33 gm/dL      RDW 24.4 %      PLT CT 338 K/cmm      MPV 8.6 fL      NEUTROPHIL % 50.6 %      Lymphocytes 31.1 %      Monocytes 14.0 %      Eosinophils % 3.1 %      Basophils % 1.2 %      Neutrophils Absolute 3.7 K/cmm      Lymphocytes Absolute 2.3 K/cmm      Monocytes Absolute 1.0 K/cmm      Eosinophils Absolute 0.2 K/cmm      BASO Absolute 0.1 K/cmm             Radiology:     XR Chest AP Portable   Final Result   No acute findings.      ReadingStation:[id number]            Imaging personally reviewed.    Assessment/Plan     Chest pain   Will admit to obs and monitor on tele    Likely dehydration is playing a role, she is orthostatically positive on my exam, will rehydrate   Repeat EKG in the am    Trend troponins   Added mag and Phos   Will follow CBC and BMP in the am   Echo tomorrow   Will hold off on stress test until she is recovered from  diarrhea    Diarrhea due to C diff    Will switch flagyl to PO vanc   Adding a probiotic  Hypertension    Continue home dyazide     Migraine    Continue home amitriptyline    Continue home inderal     GERD    pepcid for home prilosec     Hyperlipidemia    Continue home statin     DVT prophylaxis    lovenox and SCDs           The assessment and plan were discussed with Dr Lacy Duverney who also saw and evaluated the patient.       ________________________  Jolinda Croak, DO-R2  Pager: [phone]  09/23/2014      JX:[id number], [place], FNP

## 2014-09-23 NOTE — ED Provider Notes (Signed)
Physician/Midlevel provider first contact with patient: 09/23/14 1505         History     Chief Complaint   Patient presents with   . Generalized weakness     The history is provided by the patient.    [name] was admitted from Friday to Sunday by [facility] with weakness brought about by C.Diff diarrhea and was also found to have MAT.  She says when she left Sunday she was feeling not back to baseline but improved.  Over the last two days she has had global weakness with chest pressure and sob with getting up to walk/move around, is better at rest.  No cough or fever or urinary symptoms.  She is still having stools but they are starting to be formed and decreasing in number and she says she is not seeing any more blood in them.  She has decreased energy.  The chest pressure and sob do not occur at rest.  Cardiac Risk Factors:  + HTN, +chol, +FH, -dm, -tob        Past Medical History   Diagnosis Date   . Carpal tunnel syndrome of right wrist 2012   . Septicemia due to E. coli FEB 2013   . Headache    . Wears glasses    . Vision problem    . Anemia    . Stomach acid    . Hypertensive disorder    . Arthritis        Past Surgical History   Procedure Laterality Date   . Cyst removal  2011     LEFT HAND   . Release, dequervain's contracture  05/25/2009   . Carpal tunnel release  03/07/2011   . Knee arthroscopy  11/03/2012   . Hysterectomy     . Appendectomy     . Bladder tac  2003       Family History   Problem Relation Age of Onset   . Heart attack Mother    . Vision loss Mother    . Diabetes Mother    . Heart attack Father    . Heart attack Other    . Ulcerative colitis Other    . Vision loss Other    . Cancer Brother        Social  History   Substance Use Topics   . Smoking status: Never Smoker    . Smokeless tobacco: Never Used   . Alcohol Use: No       .     Allergies   Allergen Reactions   . Hydrocodone Rash       Current/Home Medications    AMITRIPTYLINE (ELAVIL) 50 MG TABLET    Take 50 mg by mouth nightly.     FAMOTIDINE (PEPCID) 40 MG TABLET        HYDROCORTISONE (ANUSOL-HC) 25 MG SUPPOSITORY    Place 1 suppository (25 mg total) rectally 2 (two) times daily.    METRONIDAZOLE (FLAGYL) 500 MG TABLET    Take 1 tablet (500 mg total) by mouth every 8 (eight) hours.    PROPRANOLOL (INDERAL LA) 160 MG SR CAPSULE    daily.       SIMVASTATIN (ZOCOR) 40 MG TABLET    Take 40 mg by mouth nightly.    TRIAMTERENE-HYDROCHLOROTHIAZIDE (MAXZIDE-25) 37.5-25 MG PER TABLET    Take 1 tablet by mouth daily.        Review of Systems   Constitutional: Negative for fever, chills, diaphoresis, activity change, appetite  change, fatigue and unexpected weight change.   HENT: Negative for congestion, dental problem, drooling, ear discharge, ear pain, facial swelling, hearing loss, mouth sores, nosebleeds, postnasal drip, rhinorrhea and sinus pressure.    Eyes: Negative.    Respiratory: Positive for shortness of breath. Negative for apnea, cough, chest tightness, wheezing and stridor.    Cardiovascular: Positive for chest pain.   Gastrointestinal: Positive for diarrhea. Negative for nausea, vomiting and rectal pain.   Endocrine: Negative.    Genitourinary: Negative.    Musculoskeletal: Negative.    Neurological: Positive for weakness. Negative for dizziness, tremors, seizures, syncope, facial asymmetry, speech difficulty, light-headedness, numbness and headaches.   Hematological: Negative.    Psychiatric/Behavioral: Negative.    All other systems reviewed and are negative.      Physical Exam    BP: 145/81 mmHg, Heart Rate: (!) 52, Temp: 97.3 F (36.3 C), Resp Rate: 18, SpO2: 96 %, Weight: 69.854 kg    Physical Exam   Constitutional: She is oriented to person, place, and time. She appears well-developed and well-nourished. No distress.   HENT:   Head: Normocephalic.   Right Ear: External ear normal.   Left Ear: External ear normal.   Mouth/Throat: Oropharynx is clear and moist.   Eyes: Pupils are equal, round, and reactive to light.   Neck: Normal  range of motion. Neck supple.   Cardiovascular: Normal rate, regular rhythm and normal heart sounds.  Exam reveals no gallop and no friction rub.    No murmur heard.  Pulmonary/Chest: Effort normal. No respiratory distress. She has no wheezes. She has no rales. She exhibits no tenderness.   Abdominal: Soft. She exhibits no distension and no mass. There is no tenderness. There is no rebound and no guarding. No hernia.   Musculoskeletal: Normal range of motion.   Legs no edema, no cords, no swelling     Neurological: She is alert and oriented to person, place, and time. She displays normal reflexes. No cranial nerve deficit. She exhibits normal muscle tone. Coordination normal.   Skin: No rash noted.   Psychiatric: She has a normal mood and affect. Her behavior is normal. Judgment and thought content normal.   Nursing note and vitals reviewed.    Results     Procedure Component Value Units Date/Time    TSH [[id number]] Collected:  09/23/14 1519    Specimen Information:  Plasma Updated:  09/23/14 1624     Thyroid Stimulating Hormone 3.46 uIU/mL     Troponin I (Stat Q4H X3) [[id number]] Collected:  09/23/14 1519    Specimen Information:  Plasma Updated:  09/23/14 1612     Troponin I 0.00 ng/mL     CMP [[id number]]  (Abnormal) Collected:  09/23/14 1519    Specimen Information:  Plasma Updated:  09/23/14 1607     Sodium 136 mMol/L      Potassium 3.7 mMol/L      Chloride 105 mMol/L      CO2 21.0 mMol/L      CALCIUM 9.6 mg/dL      Glucose 086 (H) mg/dL      Creatinine 5.78 mg/dL      BUN 22 mg/dL      Protein, Total 7.0 gm/dL      Albumin 4.1 gm/dL      Alkaline Phosphatase 76 U/L      ALT 26 U/L      AST (SGOT) 28 U/L      Bilirubin, Total 0.3 mg/dL  Albumin/Globulin Ratio 1.39 Ratio      Anion Gap 13.7 mMol/L      BUN/Creatinine Ratio 24.7 Ratio      EGFR >60 mL/min/1.67m2      Osmolality Calc 276 mOsm/kg      Globulin 2.9 gm/dL     CBC [[id number]] Collected:  09/23/14 1519    Specimen Information:  Blood / Blood  Updated:  09/23/14 1557     WBC 7.3 K/cmm      RBC 4.83 M/cmm      Hemoglobin 14.9 gm/dL      Hematocrit 40.9 %      MCV 94 fL      MCH 31 pg      MCHC 33 gm/dL      RDW 81.1 %      PLT CT 338 K/cmm      MPV 8.6 fL      NEUTROPHIL % 50.6 %      Lymphocytes 31.1 %      Monocytes 14.0 %      Eosinophils % 3.1 %      Basophils % 1.2 %      Neutrophils Absolute 3.7 K/cmm      Lymphocytes Absolute 2.3 K/cmm      Monocytes Absolute 1.0 K/cmm      Eosinophils Absolute 0.2 K/cmm      BASO Absolute 0.1 K/cmm         Xr Chest Ap Portable    09/23/2014   No acute findings.  ReadingStation:[id number]        MDM and ED Course   1651 - Resting comfortably.  NO chest pressure or sob while here.  Appears well.  I have consulted [facility] for admission for rule out and stress test.    ED Medication Orders     Start     Status Ordering Provider    09/23/14 1527  aspirin chewable tablet 324 mg   Once in ED     Route: Oral  Ordered Dose: 324 mg     Last MAR action:  Given Velora Horstman, Al Corpus    09/23/14 1527  sodium chloride 0.9 % bolus 1,000 mL   Once in ED     Route: Intravenous  Ordered Dose: 1,000 mL     Last MAR action:  New Bag Selita Staiger, Al Corpus              MDM       Procedures    Clinical Impression & Disposition     Clinical Impression  Final diagnoses:   Acute chest pain   Weakness        ED Disposition     Admit Bed Type: Telemetry [5]  Admitting Physician: Tamala Julian [[id number]]  Patient Class: Observation [104]             New Prescriptions    No medications on file                   Nicolasa Ducking, MD  09/23/14 1653

## 2014-09-24 ENCOUNTER — Other Ambulatory Visit: Payer: BC Managed Care – PPO | Admitting: Family Medicine

## 2014-09-24 ENCOUNTER — Other Ambulatory Visit (INDEPENDENT_AMBULATORY_CARE_PROVIDER_SITE_OTHER): Payer: Self-pay | Admitting: Family Medicine

## 2014-09-24 ENCOUNTER — Observation Stay (HOSPITAL_BASED_OUTPATIENT_CLINIC_OR_DEPARTMENT_OTHER): Payer: BC Managed Care – PPO

## 2014-09-24 DIAGNOSIS — I10 Essential (primary) hypertension: Secondary | ICD-10-CM

## 2014-09-24 DIAGNOSIS — I471 Supraventricular tachycardia: Secondary | ICD-10-CM

## 2014-09-24 DIAGNOSIS — R079 Chest pain, unspecified: Secondary | ICD-10-CM

## 2014-09-24 DIAGNOSIS — E860 Dehydration: Secondary | ICD-10-CM

## 2014-09-24 LAB — BASIC METABOLIC PANEL
Anion Gap: 10.9 mMol/L (ref 7.0–18.0)
BUN / Creatinine Ratio: 23.1 Ratio (ref 10.0–30.0)
BUN: 18 mg/dL (ref 7–22)
CO2: 24 mMol/L (ref 20.0–30.0)
Calcium: 8.7 mg/dL (ref 8.5–10.5)
Chloride: 109 mMol/L (ref 98–110)
Creatinine: 0.78 mg/dL (ref 0.60–1.20)
EGFR: >60 mL/min/{1.73_m2}
Glucose: 91 mg/dL (ref 70–99)
Osmolality Calc: 281 mOsm/kg (ref 275–300)
Potassium: 3.9 mMol/L (ref 3.5–5.3)
Sodium: 140 mMol/L (ref 136–147)

## 2014-09-24 LAB — ECG 12-LEAD
P Wave Axis: 74 deg
P Wave Duration: 116 ms
P-R Interval: 180 ms
Patient Age: 68 years
Q-T Dispersion: 12 ms
Q-T Interval(Corrected): 442 ms
Q-T Interval: 388 ms
QRS Axis: 56 deg
QRS Duration: 86 ms
T Axis: 57 deg
Ventricular Rate: 78 /min

## 2014-09-24 LAB — CBC
Hematocrit: 39.3 % (ref 36.0–48.0)
Hemoglobin: 12.8 gm/dL (ref 12.0–16.0)
MCH: 30 pg (ref 28–35)
MCHC: 33 gm/dL (ref 33–37)
MCV: 94 fL (ref 80–100)
MPV: 8.3 fL (ref 8.0–12.0)
PLT CT: 231 10*3/uL (ref 130–440)
RBC: 4.2 10*6/uL (ref 3.80–5.00)
RDW: 14.1 % (ref 12.0–15.0)
WBC: 4.8 10*3/uL (ref 4.0–11.0)

## 2014-09-24 LAB — MAGNESIUM: Magnesium: 1.9 mg/dL (ref 1.6–2.6)

## 2014-09-24 LAB — TROPONIN I
Troponin I: 0 ng/mL (ref 0.00–0.02)
Troponin I: 0 ng/mL (ref 0.00–0.02)

## 2014-09-24 MED ORDER — AMITRIPTYLINE HCL 25 MG PO TABS
25.0000 mg | ORAL_TABLET | Freq: Every evening | ORAL | Status: DC
Start: 2014-09-24 — End: 2014-09-24

## 2014-09-24 MED ORDER — VANCOMYCIN HCL 125 MG PO CAPS
125.0000 mg | ORAL_CAPSULE | Freq: Four times a day (QID) | ORAL | Status: AC
Start: 2014-09-24 — End: 2014-10-04

## 2014-09-24 MED ORDER — PRAVASTATIN SODIUM 40 MG PO TABS
40.0000 mg | ORAL_TABLET | Freq: Every evening | ORAL | Status: AC
Start: 2014-09-24 — End: ?

## 2014-09-24 MED ORDER — RISAQUAD PO CAPS
1.0000 | ORAL_CAPSULE | Freq: Every day | ORAL | Status: AC
Start: 2014-09-24 — End: ?

## 2014-09-24 NOTE — Plan of Care (Signed)
MD assessed patient and is stable for discharge.

## 2014-09-24 NOTE — Progress Notes (Signed)
INITIAL ASSESSMENT  Case Management / Social Work       Estimated D/C Date: 1.30    PCP: [facility]    Home assessment/PLOF: Home, independent in ADL's    DME's:     Inpatient Plan of Care: Observation stay, CP    Readmission Review: NA    Anticipated D/C needs:  No needs noted    Transportation: auto    Barriers to discharge:  none    Interventions:  Chart reviewed, no needs noted, will follow    D/C Plan:  Home, no needs    Alford Highland RN, BSN  Nurse Case Manager  [phone]

## 2014-09-24 NOTE — Progress Notes (Signed)
Recvd report from [employer]

## 2014-09-24 NOTE — Discharge Summary (Signed)
Discharge Summary    Date:09/24/2014   Patient Name: [name]  Attending Physician: Tamala Julian, MD    Date of Admission:   09/23/2014    Date of Discharge:   09/24/2014    Admitting Diagnosis:   Chest pain, dehydration due to diarrhea    Discharge Dx:     Principal Diagnosis (Diagnosis after study, that is chiefly responsible for admission to inpatient status): dehydration due to diarrhea from c diff    Active [name] Problems    Diagnosis POA   . Migraine No   . C. difficile colitis Yes   . Diarrhea Yes   . Hypertension Yes   . Multifocal atrial tachycardia Yes   . Pure hypercholesterolemia Yes      Resolved [name] Problems    Diagnosis POA   . Principal Problem: Chest pain Yes   . Dehydration Yes   . Dehydration Yes       Treatment Team:   Treatment Team:   Attending Provider: Tamala Julian, MD  Resident: Tama Gander, DO     Procedures performed:   Radiology: all results from this admission  Xr Chest Ap Portable    09/23/2014   No acute findings.  ReadingStation:[id number]    Xr Chest Ap Portable    09/17/2014   Normal one view chest exam.  ReadingStation:[email or web address]    Ct Abdomen Pelvis W Iv And Po Cont    09/17/2014   Mild thickening of the sigmoid colon and cecum may suggests colitis. No evidence of diverticulitis.  ReadingStation:[email or web address]      Reason for Admission:     [name] is a 69 y.o. female who presents to the [name] with a 2 day history of Chest pressure with SOB on exertion. This is associated with generalized weakness and a worsening of her c diff diarrhea which had improved altogether on Flagyl. She describes the chest pressure as a heaviness, not actually painful. She has been dizzy and feeling "spaced out," "like I am spinning." She has also had a constant headache, not severe and not unusual for her. She was recently admitted with MAT thought to be due to dehydration from c diff diarrhea, improved with rehydration. She is scheduled for an echo and a stress  test next week.       [name] Course:     [name] was closely monitored on tele overnight, she had no significant events and her morning EKG is not remarkably different from previous. Troponins were negative x3. The morning following admission she walked in the hallways with nursing and did not feel dizzy. She had an echo prior to d/c and a stress test was ordered in addition to her previously scheduled cardiology f/u and holter monitor. Preliminary report on the echo was still pending at the time of [name]. She is being switched from flagyl to PO vanc for her c difficile diarrhea (which is improved this morning).     Condition at Discharge:     Stable    Today:     BP 137/59 mmHg  Pulse 73  Temp(Src) 98 F (36.7 C) (Tympanic)  Resp 16  Ht 1.6 m (5\' 3" )  Wt 69.8 kg (153 lb 14.1 oz)  BMI 27.27 kg/m2  SpO2 97%  Ranges for the last 24 hours:  Temp:  [97.3 F (36.3 C)-98.4 F (36.9 C)] 98 F (36.7 C)  Heart Rate:  [52-85] 73  Resp Rate:  [16-18] 16  BP: (123-145)/(47-81) 137/59 mmHg  Last set of labs     Recent Labs  Lab 09/24/14  0507   WBC 4.8   HEMOGLOBIN 12.8   HEMATOCRIT 39.3   PLATELETS 231       Recent Labs  Lab 09/24/14  0507   SODIUM 140   POTASSIUM 3.9   CHLORIDE 109   CO2 24.0   BUN 18   CREATININE 0.78   EGFR >60   GLUCOSE 91   CALCIUM 8.7       Recent Labs  Lab 09/23/14  2326 09/23/14  2009 09/23/14  1519   TROPONIN I 0.00 0.01 0.00       Micro / Labs / Path pending:     Unresulted Labs     Procedure . . . Date/Time    Troponin I [[id number]] Collected:  09/24/14 0706    Specimen Information:  Plasma Updated:  09/24/14 0706            Discharge Instructions:     Follow-up Information     Follow up with Gershon Crane, FNP In 3 days.    Specialty:  Family Nurse Practitioner    Why:  or Dr Estella Husk information:    [address]  [place] [address]  [phone]            Discharge Diet: Cardiac Diet, BRAT diet     Activity/Weight bearing status: as tolerated     Disposition:   Home or Self Care       Discharge Medication List      Taking          amitriptyline 25 MG tablet   Dose:  25 mg   What changed:    - medication strength  - how much to take   Commonly known as:  ELAVIL   Take 1 tablet (25 mg total) by mouth nightly.       famotidine 40 MG tablet   Commonly known as:  PEPCID     -        hydrocortisone 25 MG suppository   Dose:  25 mg   Commonly known as:  ANUSOL-HC   Place 1 suppository (25 mg total) rectally 2 (two) times daily.       pravastatin 40 MG tablet   Dose:  40 mg   Commonly known as:  PRAVACHOL   Take 1 tablet (40 mg total) by mouth nightly.       propranolol 160 MG SR capsule   Commonly known as:  INDERAL LA   - daily.     -        triamterene-hydrochlorothiazide 37.5-25 MG per tablet   Dose:  1 tablet   Commonly known as:  MAXZIDE-25   Take 1 tablet by mouth daily.       vancomycin 125 MG capsule   Dose:  125 mg   Commonly known as:  VANCOCIN   Take 1 capsule (125 mg total) by mouth 4 (four) times daily.         STOP taking these medications          metroNIDAZOLE 500 MG tablet   Commonly known as:  FLAGYL       simvastatin 40 MG tablet   Commonly known as:  ZOCOR           Minutes spent coordinating discharge and reviewing discharge plan: 45 minutes      Signed by: Tama Gander, DO  Pt seen, examined, counseled.  Care reviewed with team.  Agree with findings and plans as documented by Dr. Thomasena Edis above.   Pt feeling well after echo.  Desires [name].  Husband present and supportive.   Agree with [place], push fluids, continue vancomycin, stop amitryptline, f/u with cardiology.

## 2014-09-24 NOTE — Progress Notes (Signed)
Discharge instructions eviewed with pt;pt verbalized am understand

## 2014-09-24 NOTE — UM Notes (Signed)
[facility] Utilization Management Review Sheet    NAME: [name]  MR#: [id number]    CSN#: [id number]    ROOM: 215/215-A AGE: 69 y.o.    ADMIT DATE AND TIME: 09/23/2014  2:55 PM      PATIENT CLASS: Observation    ATTENDING PHYSICIAN: Tamala Julian, MD  PAYOR:Payor: OUT OF STATE BLUE CROSS / Plan: BCBS BLUE CARD OUT OF STATE / Product Type: *No Product type* /       AUTH #:     DIAGNOSIS:     ICD-10-CM    1. Acute chest pain R07.9    2. Weakness R53.1        HISTORY:   Past Medical History   Diagnosis Date   . Carpal tunnel syndrome of right wrist 2012   . Septicemia due to E. coli FEB 2013   . Headache    . Wears glasses    . Vision problem    . Anemia    . Stomach acid    . Hypertensive disorder    . Arthritis        DATE OF REVIEW: 09/24/2014     History of present illness:  Patient admitted through emergency department who presented with chest pressure and SOB on exertion.  This is associated with generalized weakness and worsening of her c. Diff diarrhea.  She describes the chest pressure as heaviness.  She has been feeling dizzy and "spaced out".    Vitals upon admission:  97.3-145/81-52-18-96%    Treatment in emergency department:  Patient received ASA PO x 1 and NS bolus in emergency department.    Physical exam:  Normal    Abnormal labs:  N/A    Imaging:  CXR negative    Assessment/Plan:  Chest pain  Will admit to obs and monitor on tele   Likely dehydration is playing a role, she is orthostatically positive on my exam, will rehydrate  Repeat EKG in the am   Trend troponins  Added mag and Phos  Will follow CBC and BMP in the am  Echo tomorrow  Will hold off on stress test until she is recovered from diarrhea    Diarrhea due to C diff   Will switch flagyl to PO vanc  Adding a probiotic    Hypertension   Continue home dyazide     Migraine   Continue home amitriptyline   Continue home inderal     GERD   pepcid for home prilosec     Hyperlipidemia   Continue home statin     DVT prophylaxis   lovenox and  SCDs     Observation appropriate per [facility] criteria set OC-022.    Freda Munro BSN RN  Utilization Review Nurse  [facility]  Phone: [phone]  Fax: [phone]          VITALS: BP 132/81 mmHg  Pulse 77  Temp(Src) 98 F (36.7 C) (Tympanic)  Resp 17  Ht 1.6 m (5\' 3" )  Wt 69.8 kg (153 lb 14.1 oz)  BMI 27.27 kg/m2  SpO2 100%

## 2014-09-24 NOTE — Discharge Instructions (Signed)
Noncardiac Chest Pain    Based on your visit today, the health care provider doesn't know what is causing your chest pain. In most cases, people who come to the emergency department with chest pain don't have a problem with their heart. Instead, the pain is caused by other conditions. These may be problems with the lungs, muscles, bones, digestive tract, nerves, or mental health.  Lung problems   Inflammation around the lungs (pleurisy)   Collapsed lung (pneumothorax)   Fluid around the lungs (pleural effusion)   Lung cancer. This is a rare cause of chest pain.  Muscle or bone problems   Inflamed cartilage between the ribs (pleurisy)   Fibromyalgia   Rheumatoid arthritis  Digestive system problems   Reflux   Stomach ulcer   Spasms of the esophagus   Gall stones   Gallbladder inflammation  Mental health conditions   Panic or anxiety attacks   Emotional distress  Your condition doesn't seem serious and your pain doesn't appear to be coming from your heart. But sometimes the signs of a serious problem take more time to appear. Watch for the warning signs listed below.  Home care  Follow these guidelines when caring for yourself at home:   Rest today and avoid strenuous activity.   Take any prescribed medicine as directed.  Follow-up care  Follow up with your health care provider, or as advised, if you don't start to feel better within 24 hours.  When to seek medical advice  Call your health care provider right away if any of these occur:   A change in the type of pain. Call if it feels different, becomes more serious, lasts longer, or begins to spread into your shoulder, arm, neck, jaw, or back.   Shortness of breath   You feel more pain when you breathe   Cough with dark-colored mucus or blood   Weakness, dizziness, or fainting   Fever of 100.4F (38C) or higher, or as directed by your health care provider   Swelling, pain, or redness in one leg     2000-2015 [employer] [address], [place] [address]. All rights reserved. This information is not intended as a substitute for professional medical care. Always follow your healthcare professional's instructions.

## 2014-09-24 NOTE — Progress Notes (Signed)
Report received from [place], [place].  PT was awake and oriented and denied any complaints.  Will continue to monitor.

## 2014-09-24 NOTE — Progress Notes (Signed)
Orthostatic BP:    Lying BP=125/49 manual, HR 82  Sitting BP= 124/45 manual, HR=84 (apical)  Standing BP=91/85 manual, HR= 84 (apical)    PT denied dizziness while standing, but stated she felt "weak" and needed to sit down.

## 2014-09-28 ENCOUNTER — Other Ambulatory Visit: Payer: BC Managed Care – PPO

## 2014-09-28 ENCOUNTER — Ambulatory Visit
Admission: RE | Admit: 2014-09-28 | Discharge: 2014-09-28 | Disposition: A | Discharge status: Home / Self Care | Payer: BC Managed Care – PPO | Source: Ambulatory Visit | Attending: Family Medicine | Admitting: Family Medicine

## 2014-09-29 NOTE — ED Provider Notes (Addendum)
Physician/Midlevel provider first contact with patient: 09/17/14 1156         History   No chief complaint on file.    HPI     69 year old female was sent from her family physician's office to emergency room for irregular heartbeat and chest pain and SOB with exertion.  Patient does state that she has had diarrhea for the past 4 days she has history of C. difficile.  The diarrhea is gradually became worse today she had stool with mucus.  Patient also complaining of weakness and being tired.  Chest pain is dull and feel like pressure more than pain.  She also states that she is very dizzy.  No nausea or vomiting.  No loss of consciousness.    Past Medical History   Diagnosis Date   . Carpal tunnel syndrome of right wrist 2012   . Septicemia due to E. coli FEB 2013   . Headache    . Wears glasses    . Vision problem    . Anemia    . Stomach acid    . Hypertensive disorder    . Arthritis        Past Surgical History   Procedure Laterality Date   . Cyst removal  2011     LEFT HAND   . Release, dequervain's contracture  05/25/2009   . Carpal tunnel release  03/07/2011   . Knee arthroscopy  11/03/2012   . Hysterectomy     . Appendectomy     . Bladder tac  2003       Family History   Problem Relation Age of Onset   . Heart attack Mother    . Vision loss Mother    . Diabetes Mother    . Heart attack Father    . Heart attack Other    . Ulcerative colitis Other    . Vision loss Other    . Cancer Brother        Social  History   Substance Use Topics   . Smoking status: Never Smoker    . Smokeless tobacco: Never Used   . Alcohol Use: No       .     Allergies   Allergen Reactions   . Hydrocodone Rash       Discharge Medication List as of 09/19/2014 10:48 AM      CONTINUE these medications which have NOT CHANGED    Details   propranolol (INDERAL LA) 160 MG SR capsule daily.   , Starting 10/17/2012, Until Discontinued, Historical Med      triamterene-hydrochlorothiazide (MAXZIDE-25) 37.5-25 MG per tablet Take 1 tablet by mouth  daily., Until Discontinued, Historical Med      amitriptyline (ELAVIL) 50 MG tablet Take 50 mg by mouth nightly., Until Discontinued, Historical Med      omeprazole (PRILOSEC) 40 MG capsule Take 40 mg by mouth daily., Until Discontinued, Historical Med      simvastatin (ZOCOR) 40 MG tablet Take 40 mg by mouth nightly., Until Discontinued, Historical Med              Review of Systems   Constitutional: Positive for activity change, appetite change and fatigue.   Cardiovascular: Positive for chest pain and palpitations.   Gastrointestinal: Positive for diarrhea.   Neurological: Positive for dizziness and weakness.   All other systems reviewed and are negative.      Physical Exam    BP: 122/89 mmHg, Heart Rate: 77, Temp: 97.3 F (36.3  C), Resp Rate: 20, SpO2: 96 %, Weight: 70 kg    Physical Exam   Constitutional: She is oriented to person, place, and time. She appears well-developed and well-nourished. No distress.   Weak-looking 69 year old female in no acute distress.   HENT:   Head: Normocephalic and atraumatic.   Right Ear: External ear normal.   Left Ear: External ear normal.   Nose: Nose normal.   Mouth/Throat: Oropharynx is clear and moist. No oropharyngeal exudate.   Neck: Normal range of motion. Neck supple. No JVD present. No tracheal deviation present. No thyromegaly present.   Cardiovascular: Normal rate, regular rhythm and normal heart sounds.    Pulmonary/Chest: Effort normal and breath sounds normal. Stridor present. No respiratory distress. She has no wheezes. She has no rales. She exhibits no tenderness.   Abdominal: Soft. Bowel sounds are normal. She exhibits no distension and no mass. There is tenderness. There is no rebound and no guarding. No hernia.   Diffusely abdominal tenderness with no guarding.  No mass palpable.   Musculoskeletal: She exhibits edema. She exhibits no tenderness.   Lymphadenopathy:     She has no cervical adenopathy.   Neurological: She is alert and oriented to person, place,  and time.   Skin: Skin is warm. No rash noted. She is not diaphoretic. No erythema. No pallor.   Nursing note and vitals reviewed.        MDM and ED Course     ED Medication Orders     Start     Status Ordering Provider    09/17/14 1315     Every 5 min PRN,   Status:  Discontinued     Route: Intravenous  Ordered Dose: 5 mg     Discontinued Fredrik Rigger    09/17/14 1248  potassium chloride 10 mEq in 100 mL IVPB (premix)   Once in ED     Route: Intravenous  Ordered Dose: 10 mEq     Last [facility] action:  New Bag Sandrea Matte M    09/17/14 1210     Once,   Status:  Discontinued     Route: Intravenous     Discontinued Fredrik Rigger    09/17/14 1205  sodium chloride 0.9 % bolus 1,000 mL   Once in ED     Route: Intravenous  Ordered Dose: 1,000 mL     Last MAR action:  Stopped Omer Monter M    09/17/14 1205  iohexol (OMNIPAQUE) 240 MG/ML IV/ORAL solution 50 mL   Once in ED     Route: Oral  Ordered Dose: 50 mL     Last MAR action:  Given Jansen Goodpasture, Jean Rosenthal              MDM     Talk to Dr. Thomasena Edis she is coming to admit patient.  Dr. Harl Bowie and Dr. Thomasena Edis R in the emergency department to admit patient.  Procedures    Clinical Impression & Disposition     Clinical Impression  Final diagnoses:   Atrial tachycardia   Colitis   Dehydration        ED Disposition     Observation Admitting Physician: Berneice Heinrich [[id number]]  Diagnosis: Multifocal atrial tachycardia [167215]  Estimated Length of Stay: < 2 midnights  Tentative Discharge Plan?: Home or Self Care [1]  Patient Class: Observation [104]             Discharge Medication List as of 09/19/2014 10:48 AM  START taking these medications    Details   hydrocortisone (ANUSOL-HC) 25 MG suppository Place 1 suppository (25 mg total) rectally 2 (two) times daily., Starting 09/19/2014, Until Discontinued, Print      metroNIDAZOLE (FLAGYL) 500 MG tablet Take 1 tablet (500 mg total) by mouth every 8 (eight) hours., Starting 09/19/2014, Until Fri 10/01/14, Print                          Rina Adney, Jean Rosenthal, MD  09/29/14 1043    Radene Gunning Jean Rosenthal, MD  09/29/14 [phone]

## 2014-09-30 ENCOUNTER — Ambulatory Visit
Admission: RE | Admit: 2014-09-30 | Discharge: 2014-09-30 | Disposition: A | Discharge status: Home / Self Care | Payer: BC Managed Care – PPO | Source: Ambulatory Visit | Attending: Family Medicine | Admitting: Family Medicine

## 2014-09-30 ENCOUNTER — Ambulatory Visit (HOSPITAL_BASED_OUTPATIENT_CLINIC_OR_DEPARTMENT_OTHER)
Admission: RE | Admit: 2014-09-30 | Discharge: 2014-09-30 | Disposition: A | Discharge status: Home / Self Care | Payer: BC Managed Care – PPO | Source: Ambulatory Visit | Attending: Family Medicine | Admitting: Family Medicine

## 2014-09-30 DIAGNOSIS — R079 Chest pain, unspecified: Secondary | ICD-10-CM

## 2014-09-30 MED ORDER — TECHNETIUM TC 99M SESTAMIBI - CARDIOLITE
45.0000 | Freq: Once | Status: AC | PRN
Start: 2014-09-30 — End: 2014-09-30
  Administered 2014-09-30: 45 via INTRAVENOUS

## 2014-09-30 MED ORDER — REGADENOSON 0.4 MG/5ML IV SOLN
0.4000 mg | Freq: Once | INTRAVENOUS | Status: AC
Start: 2014-09-30 — End: 2014-09-30
  Administered 2014-09-30: 0.4 mg via INTRAVENOUS
  Filled 2014-09-30: qty 5

## 2014-09-30 MED ORDER — TECHNETIUM TC 99M SESTAMIBI - CARDIOLITE
15.1100 | Freq: Once | Status: AC | PRN
Start: 2014-09-30 — End: 2014-09-30
  Administered 2014-09-30: 15.11 via INTRAVENOUS

## 2014-09-30 NOTE — Progress Notes (Signed)
LEXISCAN STRESS TEST DONE. NO ST CHANGES. NO CP. BASELINE EKG SINUS WITH BIGEMINY PVC; CONVERTED TO NSR APPROX. 1:15 MIN EXERCISE TIME; RETURNED TO BASELINE IN RECOVERY. FREQUENT PVC.  NORMAL HEMODYNAMICS. SESTAMIBI IMAGES PENDING.  Lebron Conners, RN

## 2014-09-30 NOTE — Procedures (Signed)
PROCEDURE PERFORMED: A 24-hour Holter monitor.    INDICATIONS: Tachycardia.    FINDINGS: A 24-hour Holter monitor was obtained for the above  indication.  Heart rate ranged from about 60 beats per minute to  122 beats per minute.  There were more than 2000 isolated  ventricular ectopic beats, 1200 of which were in bigeminal  cycles.  There were 26 couplets and no runs of V-tach.  There  were 21,000 isolated supraventricular ectopic beats with three  couplets and no runs.  There were no pauses.  There were periods  of sinus tachycardia with rates in the 120s.    No patient diary was returned, so symptoms cannot be  correlated with findings.    IMPRESSION: Multiple episodes of ventricular ectopic beats and  bigeminal cycles, periods of sinus tachycardia and isolated  supraventricular ectopic beats.  Suggest correlation of findings  with symptoms.

## 2014-10-30 ENCOUNTER — Other Ambulatory Visit
Admission: RE | Admit: 2014-10-30 | Discharge: 2014-10-30 | Disposition: A | Discharge status: Home / Self Care | Payer: Self-pay | Source: Ambulatory Visit

## 2014-11-01 ENCOUNTER — Other Ambulatory Visit: Payer: Self-pay | Admitting: Specialist

## 2014-11-01 DIAGNOSIS — R200 Anesthesia of skin: Secondary | ICD-10-CM

## 2014-11-01 LAB — VH C. DIFFICILE TOXIN B GENE BY DNA AMPLIFICATION: Stool Clostridium difficile Toxin B Gene DNA Amplification: POSITIVE — CR

## 2014-11-08 ENCOUNTER — Ambulatory Visit (HOSPITAL_BASED_OUTPATIENT_CLINIC_OR_DEPARTMENT_OTHER): Admit: 2014-11-08 | Payer: Self-pay | Admitting: Gastroenterology

## 2014-11-08 ENCOUNTER — Encounter (HOSPITAL_BASED_OUTPATIENT_CLINIC_OR_DEPARTMENT_OTHER): Payer: Self-pay

## 2014-11-08 SURGERY — DONT USE, USE 1094-COLONOSCOPY, DIAGNOSTIC (SCREENING)
Anesthesia: Conscious Sedation | Site: Anus

## 2014-11-10 ENCOUNTER — Other Ambulatory Visit: Payer: Self-pay | Admitting: Specialist

## 2014-11-10 ENCOUNTER — Ambulatory Visit
Admission: RE | Admit: 2014-11-10 | Discharge: 2014-11-10 | Disposition: A | Discharge status: Home / Self Care | Payer: BC Managed Care – PPO | Source: Ambulatory Visit | Attending: Specialist | Admitting: Specialist

## 2014-11-10 ENCOUNTER — Encounter: Payer: Self-pay | Admitting: Cardiovascular Disease

## 2014-11-10 ENCOUNTER — Ambulatory Visit (INDEPENDENT_AMBULATORY_CARE_PROVIDER_SITE_OTHER): Payer: BC Managed Care – PPO | Admitting: Cardiovascular Disease

## 2014-11-10 VITALS — BP 110/90 | HR 76 | Ht 63.0 in | Wt 151.5 lb

## 2014-11-10 DIAGNOSIS — E7800 Pure hypercholesterolemia, unspecified: Secondary | ICD-10-CM

## 2014-11-10 DIAGNOSIS — I10 Essential (primary) hypertension: Secondary | ICD-10-CM

## 2014-11-10 DIAGNOSIS — R200 Anesthesia of skin: Secondary | ICD-10-CM | POA: Insufficient documentation

## 2014-11-10 DIAGNOSIS — R0609 Other forms of dyspnea: Secondary | ICD-10-CM | POA: Insufficient documentation

## 2014-11-10 DIAGNOSIS — I340 Nonrheumatic mitral (valve) insufficiency: Secondary | ICD-10-CM | POA: Insufficient documentation

## 2014-11-10 DIAGNOSIS — M79604 Pain in right leg: Secondary | ICD-10-CM | POA: Insufficient documentation

## 2014-11-10 DIAGNOSIS — R072 Precordial pain: Secondary | ICD-10-CM

## 2014-11-10 NOTE — Progress Notes (Signed)
Cardiology Consult Note      Date Time: 11/10/2014 4:06 PM  Patient Name: [name]    Past Medical History   Diagnosis Date   . Carpal tunnel syndrome of right wrist 2012   . Septicemia due to E. coli FEB 2013   . Headache    . Wears glasses    . Vision problem    . Anemia    . Stomach acid    . Hypertensive disorder    . Arthritis          Surgical History  Past Surgical History   Procedure Laterality Date   . Cyst removal  2011     LEFT HAND   . Release, dequervain's contracture  05/25/2009   . Carpal tunnel release  03/07/2011   . Knee arthroscopy  11/03/2012   . Hysterectomy     . Appendectomy     . Bladder tac  2003         Subjective:       This is a pleasant 69 year old white female who presents in referral for evaluation of an episode of chest discomfort and shortness of breath noted on hospitalization in January for C. difficile. 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 To her this is a routine occurrence and resolves within 5 minutes of rest. She is able to continue to work on a regular basis with no complaints. She states it is been stable for years.    Recently she had a severe case of C. difficile treated in January. At that time an echocardiogram was obtained and was read as 3+ MR with right heart enlargement. On review of her prior echo from 2013 the MR was new for the mild right heart enlargement had been seen in the past. Estimate pulmonary pressures were normal. She was readmitted with dehydration chest discomfort and shortness of breath in February. She underwent stress testing as an outpatient which was negative for ischemic disease and she's been referred to cardiology.    Additionally during her dehydration admission she was noted to have irregular rhythm likely multifocal atrial tachycardia. She had been treated with propranolol  for years for hypertension this possibility that this was not being well absorbed during that hospitalization. She has not had any significant tachycardia that she occasionally feels palpitations.    Patient denies PND orthopnea or significant peripheral edema lightheadedness or syncope.    1. Precordial pain     2. DOE (dyspnea on exertion)     3. Non-rheumatic mitral regurgitation     4. Essential hypertension     5. Pure hypercholesterolemia         Family history    Mother died at age 52 with congestive heart failure    Father died at age 11 with an MI    7 siblings multiple with coronary disease    Social History  History     Social History   . Marital Status: Married     Spouse Name: N/A     Number of Children: N/A   . Years of Education: N/A     Occupational History   . Not on file.     Social History Main Topics   . Smoking status: Never Smoker    . Smokeless tobacco: Never Used   . Alcohol Use: No   .  Drug Use: No   . Sexual Activity: Not on file     Other Topics Concern   . Not on file     Social History Narrative     Patient is married with 3 healthy children she works in housekeeping she is never smoked    ROS (refer to cardiac intake form)  Constitutional: No significant weight change   Neurologic: No focal or lateralizing neurologic symptoms   GI/GU: No nausea, vomiting, diarrhea, constipation, bleeding, or dyspepsia   Cardiac: No , PND, orthopnea,  lightheadedness, or syncope; occasional palpitations exertional shortness of breath and chest discomfort   Pulmonary: No shortness of breath, chest tightness or cough   Peripheral: No claudication or edema   Musculoskeletal: No myalgias or arthralgias to me: Chronic low back pain and right leg discomfort  Skin:No new rashes  Psych: No depression or anxiety    Physical Exam:     Blood pressure 110/90, pulse 76, height 1.6 m (5\' 3" ), weight 68.72 kg (151 lb 8 oz).  Wt Readings from Last 2 Encounters:   11/10/14 68.72 kg (151 lb 8 oz)   09/23/14 69.8 kg (153  lb 14.1 oz)       General: No apparent distress white female   Neck: No JVD, no bruits  Lungs: Clear to auscultation   Heart: Regular rate and rhythm without gallop or rub 1/6 systolic murmur  Abdomen: Soft, nontender, no hepatosplenomegaly, abdominal bruits present   Extremities: No cyanosis or edema. Trace pedal pulses no femoral bruits  Skin: No new rashes  Neuro: alert and oriented with normal affet    Medications:     Current Outpatient Rx   Name  Route  Sig  Dispense  Refill   . famotidine (PEPCID) 40 MG tablet                     . hydrocortisone (ANUSOL-HC) 25 MG suppository    Rectal    Place 1 suppository (25 mg total) rectally 2 (two) times daily.    10 suppository    1     . lactobacillus/streptococcus (RISAQUAD) Cap    Oral    Take 1 capsule by mouth daily.    90 capsule    1     . pravastatin (PRAVACHOL) 40 MG tablet    Oral    Take 1 tablet (40 mg total) by mouth nightly.             . propranolol (INDERAL LA) 160 MG SR capsule        daily.                Marland Kitchen triamterene-hydrochlorothiazide (MAXZIDE-25) 37.5-25 MG per tablet    Oral    Take 1 tablet by mouth daily.                 Labs:     Lab Results   Component Value Date    GLU 91 09/24/2014    BUN 18 09/24/2014    CREAT 0.78 09/24/2014    NA 140 09/24/2014    K 3.9 09/24/2014    CL 109 09/24/2014    CO2 24.0 09/24/2014    MG 1.9 09/24/2014    AST 28 09/23/2014    ALT 26 09/23/2014    TSH 3.46 09/23/2014       No results found for: BNP    No results found for: CHOL, TRIG, HDL, LDL  EKG tracing reviewed is normal  Assessment / Plan:   1.  Chest pain: Patient is an exertional chest pain syndrome which she states has been present for years and stable. Stress testing is negative. EF is normal. She was offered further investigation by cardiac catheterization in light of her persisting symptoms however with her stability of symptoms she is comfortable with continued waitful watching and will report any new symptoms.    2. Mitral regurgitation:  Patient's echo was reviewed during the clinic visit on my review her mitral regurgitation is only mild. Her right heart though prominent has normal function with no evidence for pulmonary hypertension. No specific therapy is necessary for further investigation.    3. Mild right heart enlargement: With her shortness of breath there would be a concern of possible intracardiac shunting. She does not have significant biatrial enlargement. There is no evidence for pulmonary hypertension and at age 39 no further investigation I believe would be indicated.    4. Hypertension: Well controlled    5. MAT?: Suspect that this was an irritable rhythm due to her acute illness and dehydration. She appears to be well controlled on propranolol    6. Disposition: Follow-up 6 months        Celene Kras, MD  11/10/2014  4:06 PM

## 2014-11-10 NOTE — Progress Notes (Signed)
EKG 11/10/14, tracing reviewed is normal. JH/NH

## 2014-11-10 NOTE — Progress Notes (Signed)
ekg done

## 2014-11-27 ENCOUNTER — Other Ambulatory Visit
Admission: RE | Admit: 2014-11-27 | Discharge: 2014-11-27 | Disposition: A | Discharge status: Home / Self Care | Payer: BC Managed Care – PPO | Source: Ambulatory Visit | Attending: Specialist | Admitting: Specialist

## 2014-11-27 DIAGNOSIS — R197 Diarrhea, unspecified: Secondary | ICD-10-CM | POA: Insufficient documentation

## 2014-11-27 DIAGNOSIS — R1084 Generalized abdominal pain: Secondary | ICD-10-CM | POA: Insufficient documentation

## 2014-11-27 DIAGNOSIS — A047 Enterocolitis due to Clostridium difficile: Secondary | ICD-10-CM | POA: Insufficient documentation

## 2014-12-28 ENCOUNTER — Ambulatory Visit (HOSPITAL_BASED_OUTPATIENT_CLINIC_OR_DEPARTMENT_OTHER): Payer: BC Managed Care – PPO | Admitting: Anesthesiology

## 2014-12-28 ENCOUNTER — Encounter (HOSPITAL_BASED_OUTPATIENT_CLINIC_OR_DEPARTMENT_OTHER)
Admission: RE | Disposition: A | Discharge status: Home / Self Care | Payer: Self-pay | Source: Ambulatory Visit | Attending: Gastroenterology

## 2014-12-28 ENCOUNTER — Ambulatory Visit
Admission: RE | Admit: 2014-12-28 | Discharge: 2014-12-28 | Disposition: A | Discharge status: Home / Self Care | Payer: BC Managed Care – PPO | Source: Ambulatory Visit | Attending: Gastroenterology | Admitting: Gastroenterology

## 2014-12-28 ENCOUNTER — Ambulatory Visit (HOSPITAL_BASED_OUTPATIENT_CLINIC_OR_DEPARTMENT_OTHER): Payer: PRIVATE HEALTH INSURANCE | Admitting: Gastroenterology

## 2014-12-28 ENCOUNTER — Encounter (HOSPITAL_BASED_OUTPATIENT_CLINIC_OR_DEPARTMENT_OTHER): Payer: Self-pay

## 2014-12-28 DIAGNOSIS — K5730 Diverticulosis of large intestine without perforation or abscess without bleeding: Secondary | ICD-10-CM | POA: Insufficient documentation

## 2014-12-28 DIAGNOSIS — K635 Polyp of colon: Secondary | ICD-10-CM | POA: Insufficient documentation

## 2014-12-28 DIAGNOSIS — K625 Hemorrhage of anus and rectum: Secondary | ICD-10-CM | POA: Insufficient documentation

## 2014-12-28 HISTORY — PX: COLONOSCOPY: SHX174

## 2014-12-28 HISTORY — DX: Hyperlipidemia, unspecified: E78.5

## 2014-12-28 HISTORY — DX: Gastro-esophageal reflux disease without esophagitis: K21.9

## 2014-12-28 SURGERY — DONT USE, USE 1094-COLONOSCOPY, DIAGNOSTIC (SCREENING)
Anesthesia: Conscious Sedation | Site: Anus | Wound class: Clean Contaminated

## 2014-12-28 MED ORDER — SODIUM CHLORIDE 0.9 % IV SOLN
INTRAVENOUS | Status: DC
Start: 2014-12-28 — End: 2014-12-28

## 2014-12-28 MED ORDER — MEPERIDINE HCL 50 MG/ML IJ SOLN
INTRAMUSCULAR | Status: DC | PRN
Start: 2014-12-28 — End: 2014-12-28
  Administered 2014-12-28 (×2): 50 mg via INTRAVENOUS

## 2014-12-28 MED ORDER — ONDANSETRON HCL 4 MG/2ML IJ SOLN
4.0000 mg | INTRAMUSCULAR | Status: DC | PRN
Start: 2014-12-28 — End: 2014-12-28

## 2014-12-28 MED ORDER — MIDAZOLAM HCL 5 MG/5ML IJ SOLN
INTRAMUSCULAR | Status: AC
Start: 2014-12-28 — End: ?
  Filled 2014-12-28: qty 10

## 2014-12-28 MED ORDER — ONDANSETRON 4 MG PO TBDP
4.0000 mg | ORAL_TABLET | ORAL | Status: DC | PRN
Start: 2014-12-28 — End: 2014-12-28

## 2014-12-28 MED ORDER — PEG 3350-KCL-NABCB-NACL-NASULF 236 G PO SOLR
4.0000 L | ORAL | Status: DC | PRN
Start: 2014-12-28 — End: 2014-12-28

## 2014-12-28 MED ORDER — VH PROPOFOL 10 MG/ML IV (NARRATOR)
INTRAVENOUS | Status: AC
Start: 2014-12-28 — End: ?
  Filled 2014-12-28: qty 20

## 2014-12-28 MED ORDER — FENTANYL CITRATE (PF) 50 MCG/ML IJ SOLN (WRAP)
INTRAMUSCULAR | Status: AC
Start: 2014-12-28 — End: ?
  Filled 2014-12-28: qty 2

## 2014-12-28 MED ORDER — MEPERIDINE HCL 50 MG/ML IJ SOLN
INTRAMUSCULAR | Status: AC
Start: 2014-12-28 — End: ?
  Filled 2014-12-28: qty 3

## 2014-12-28 MED ORDER — VH MIDAZOLAM HCL 5 MG/5 ML (NARRATOR)
INTRAMUSCULAR | Status: DC | PRN
Start: 2014-12-28 — End: 2014-12-28
  Administered 2014-12-28 (×2): 2 mg via INTRAVENOUS

## 2014-12-28 SURGICAL SUPPLY — 10 items
CONN IRRG TORRENT 1WAYVAL ENDO (Supply) ×3 IMPLANT
FORCEP BIOPSY HOT RADIAL JAW 4 (Supply) IMPLANT
FORCEP BIOPSY RAD JAW 1333-40 (Supply) IMPLANT
FORCEP RADIAL JAW JUMBO 240CM (Supply) IMPLANT
MARKER ENDOSCOPIC SPOT (Supply) IMPLANT
NDL INTERJECT SCLERO 25G (Supply) IMPLANT
SNARE ROTATE SM OVAL MED STFF (Supply) IMPLANT
SNARE SMALL CAPTIV 6230 (Supply) IMPLANT
SNARE WIRE DISP 10MM OVAL (Supply) IMPLANT
TUBING IRRIGATION TORRENT (Supply) ×3 IMPLANT

## 2014-12-28 NOTE — OR Nursing (Signed)
Pt. Moved to holding area due to lower B/p 86/50. Pt. Readily accessed  By holding nurse.

## 2014-12-28 NOTE — Brief Op Note (Signed)
Colonoscopy polypectomy  MAC    The cecum is normal.   - The ascending colon is normal.   - The transverse colon is normal.   - The descending colon is normal.   - Diverticulosis in the sigmoid colon.  There was no evidence of diverticular bleeding.   - Three 3 to 4 mm polyps in the sigmoid colon.  Resected and retrieved.   - The rectum is normal.   - the polyps removed do not appear adenomatous ? even hyperplastic ? related to previous episode of colitis now healed There is no endsocopic changes to suggest either C Diff of ischemic colitis    No immediate complications Images under media TAB

## 2014-12-28 NOTE — Discharge Instructions (Signed)
[facility]  Colonoscopy/Sigmoidoscopy  Discharge Instructions    Thank you for allowing us to be a part of your health care experience.  We realize you may not fully recall your discharge care.  The following information is to guide you.    A.  After you leave the hospital:   1.  Due to the effects of the sedatives, you may feel tired for the remainder of today.   2.  DO NOT DRIVE OR OPERATE HAZARDOUS MACHINERY until tomorrow.   3.  Please rest and drink extra fluids today.  Avoid alcohol today.   4.  Resume your normal diet as tolerated.   5. If you experience anal soreness, you may apply a soothing ointment of your choice.   6.  You may feel bloated and pass air today.   7.  You may resume your normal activities (work) tomorrow.    B.  If you experience any of the following danger signs or symptoms, call your doctor immediately:  After business hours call [phone] for [facility] for physician guidance.    1.   Passing  Blood in the stool or a change of stool consistency (black).   2.  Passing Clotted blood.   3.  New onset of pain or distension, that does not subside or prevents you from normal activity.   4.  Redness or swelling at the IV insertion site that continues or worsens over 2-3 days. Initial warm compresses may be helpful.    All [facility] who supported you today during your procedure wish you a quick and comfortable recovery.  For any questions or concerns about your personal care, contact us at [phone].

## 2014-12-29 ENCOUNTER — Encounter (HOSPITAL_BASED_OUTPATIENT_CLINIC_OR_DEPARTMENT_OTHER): Payer: Self-pay | Admitting: Gastroenterology

## 2024-02-04 IMAGING — MR MRA HEAD (BRAIN) ANGIO WITHOUT IV CONTRAST
5 series · 43 of 48 positions shown · IV contrast (agent unspecified)
Comparison: None.

FINAL REPORT:
MRA HEAD without contrast:
HISTORY: right sided headache
TECHNIQUE: Routine noncontrast 3-D time-of-flight MR angiogram of the intracranial arteries performed.

[Series 1: survey · sagittal · 1.6mm · 1.62mm/px · 19 of 126 slices shown]
[im 1/126]
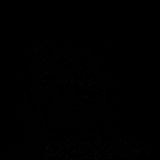
[im 7/126]
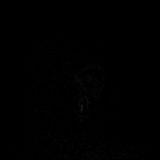
[im 14/126]
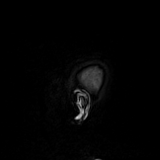
[im 21/126]
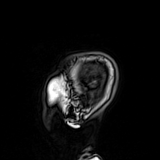
[im 28/126]
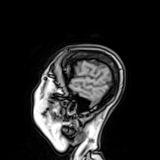
[im 35/126]
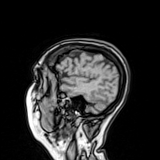
[im 42/126]
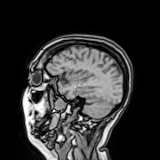
[im 49/126]
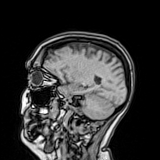
[im 56/126]
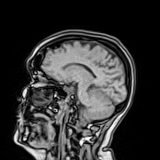
[im 63/126]
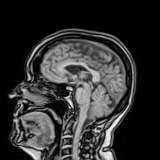
[im 70/126]
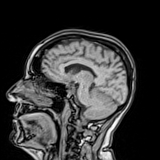
[im 77/126]
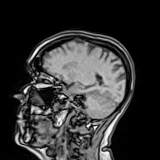
[im 84/126]
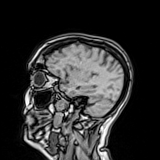
[im 91/126]
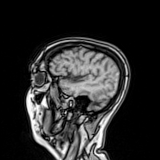
[im 98/126]
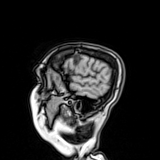
[im 105/126]
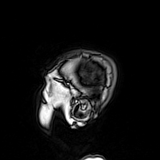
[im 112/126]
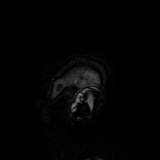
[im 119/126]
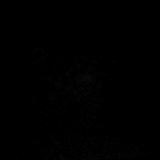
[im 126/126]
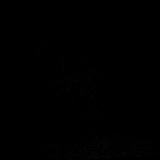

[Series 2: survey_mpr_sag · sagittal · 1.6mm · 1.60mm/px · 1 of 5 slices shown]
[im 1/5]
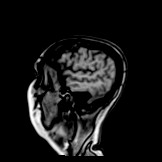

[Series 3: survey_mpr_cor · coronal · 1.6mm · 1.60mm/px · 1 of 3 slices shown]
[im 1/3]
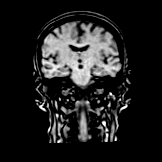

[Series 4: survey_mpr_(person_name) · axial · 1.6mm · 1.60mm/px · 1 of 3 slices shown]
[im 1/3]
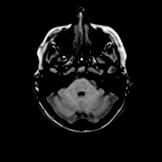

[Series 6: MRA · axial · 0.5mm · 0.39mm/px · z∈[-43,+36]mm · 21 of 168 slices shown]
[im 1/168]
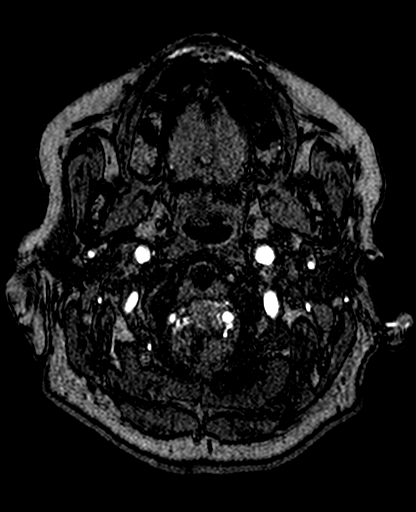
[im 7/168]
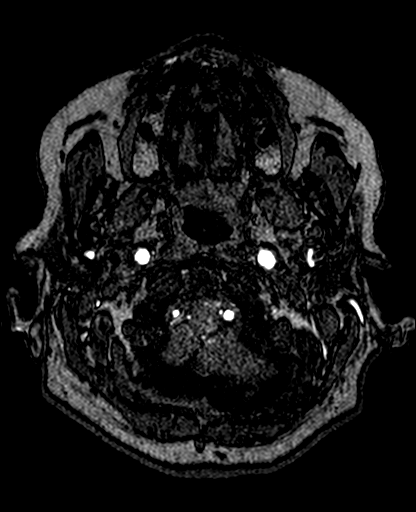
[im 14/168]
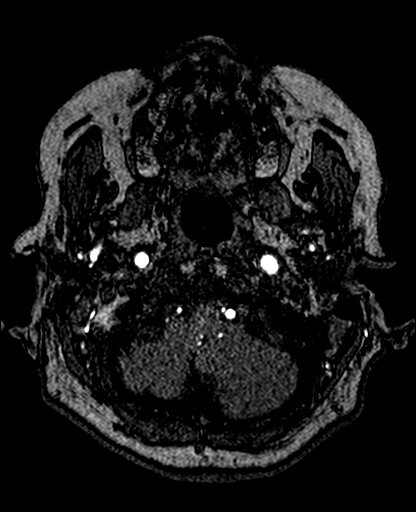
[im 21/168]
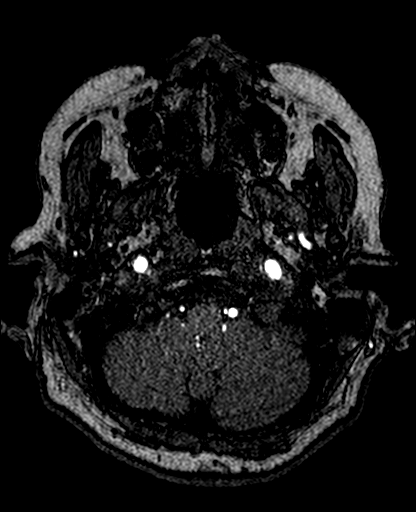
[im 27/168]
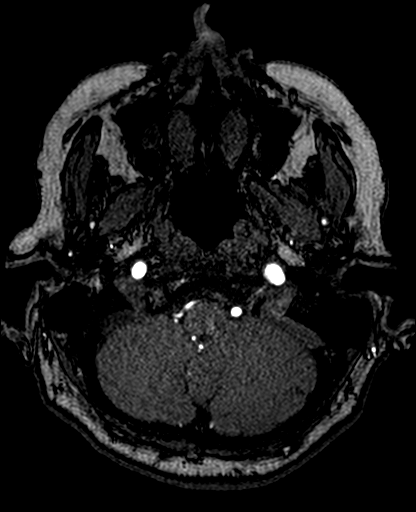
[im 34/168]
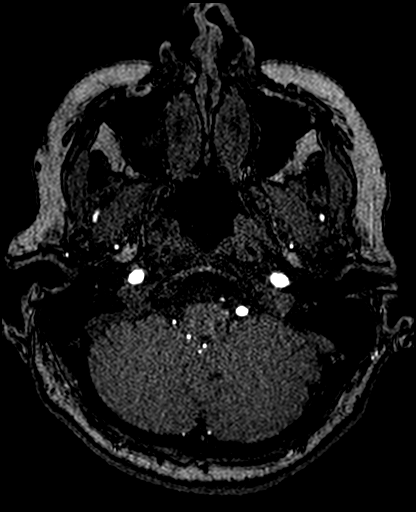
[im 41/168]
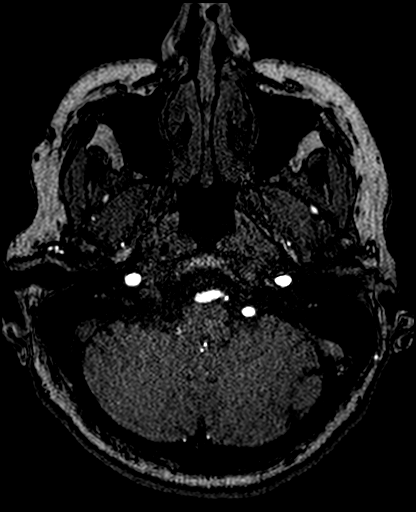
[im 47/168]
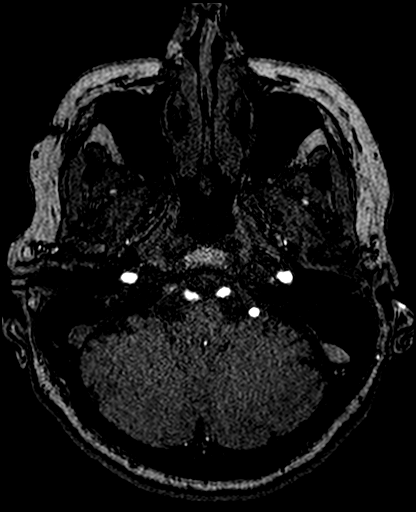
[im 54/168]
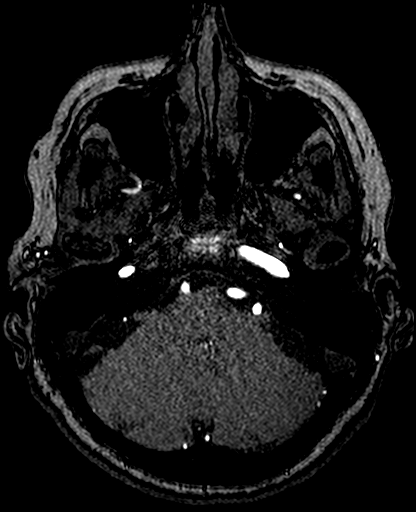
[im 61/168]
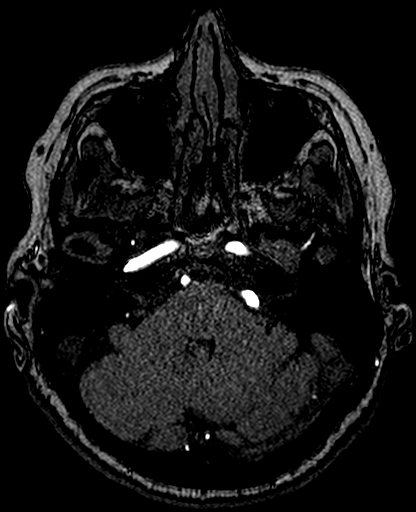
[im 67/168]
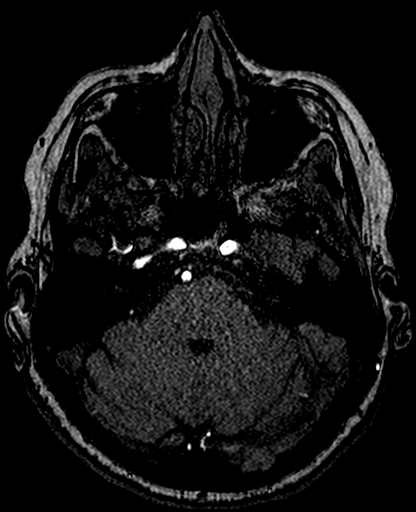
[im 74/168]
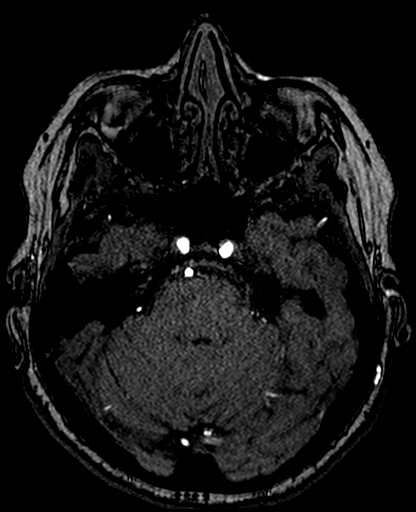
[im 81/168]
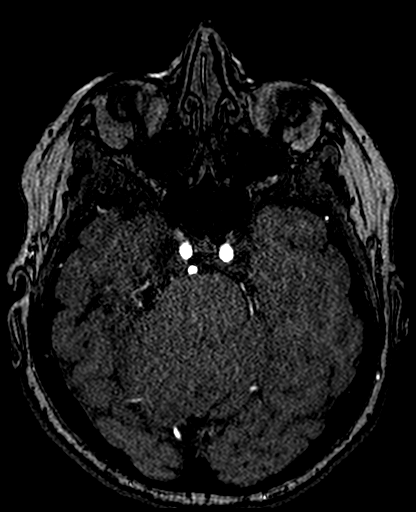
[im 87/168]
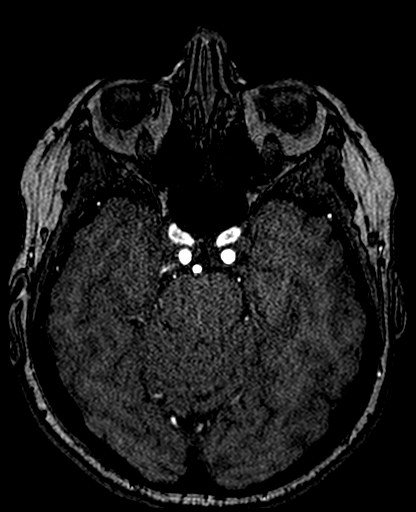
[im 94/168]
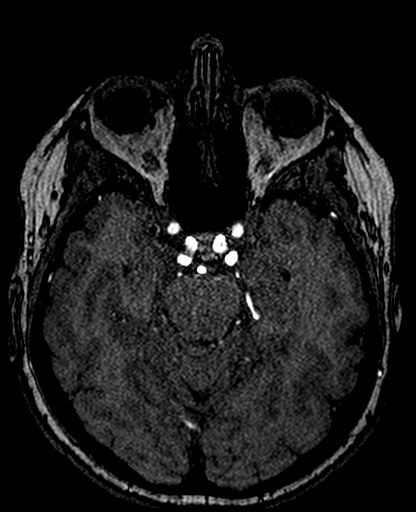
[im 101/168]
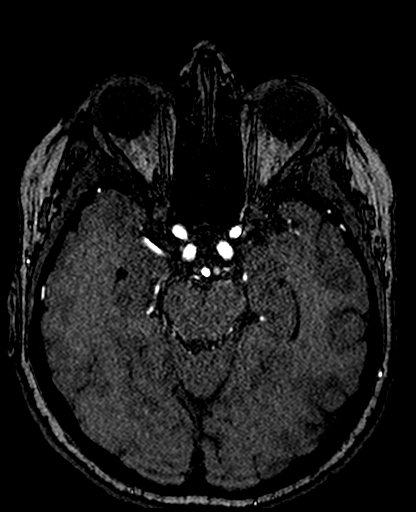
[im 107/168]
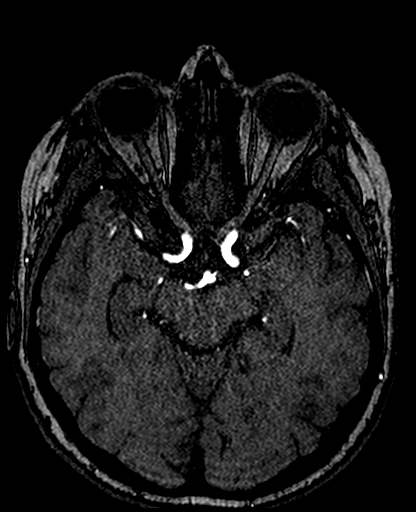
[im 114/168]
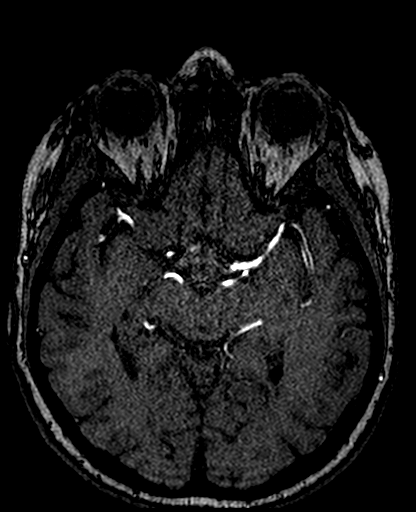
[im 121/168]
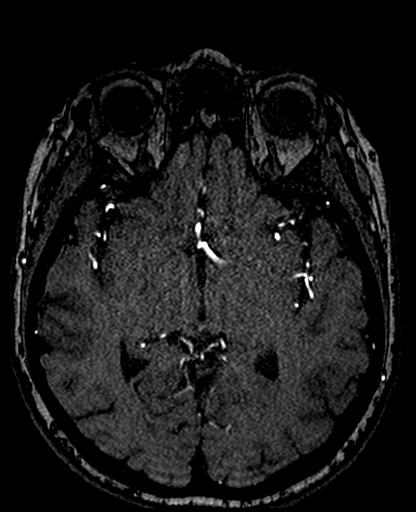
[im 141/168]
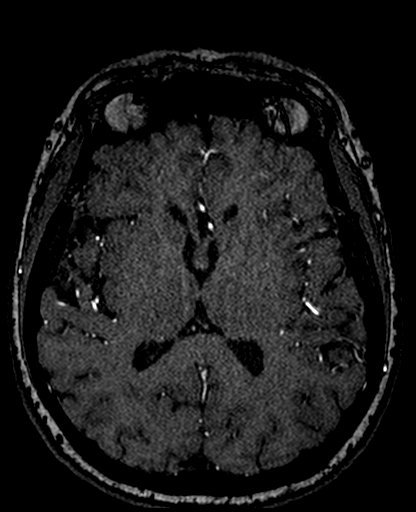
[im 161/168]
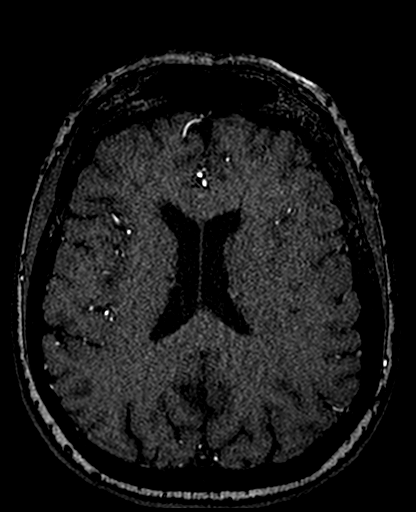

[43 of 48 positions shown; findings below may reference images not displayed]

FINDINGS: There is no aneurysm identified.
Normal flow signal is demonstrated within the intracranial ICAs, ACAs, MCAs, and their major proximal branches.
Normal flow signal is demonstrated within the vertebral arteries, basilar artery and posterior cerebral arteries.
IMPRESSION: No acute arterial abnormality in the head.

## 2024-02-04 IMAGING — MR MRI BRAIN WITHOUT CONTRAST
12 series · 48 of 48 positions shown · non-contrast
Comparison: None.

FINAL REPORT:
EXAM: MRI of the brain without contrast.
HISTORY: right sided headache
TECHNIQUE: Multisequence multiplanar images of the brain were obtained without contrast.

[Series 1: survey · sagittal · 1.6mm · 1.62mm/px · 15 of 126 slices shown]
[im 1/126]
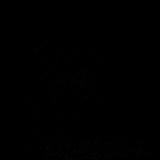
[im 9/126]
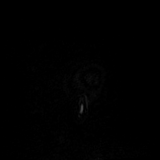
[im 18/126]
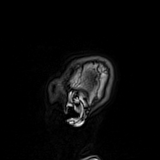
[im 27/126]
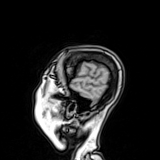
[im 36/126]
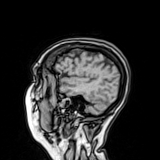
[im 45/126]
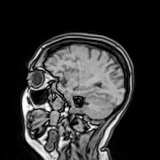
[im 54/126]
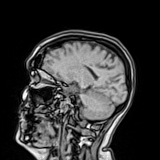
[im 63/126]
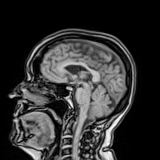
[im 72/126]
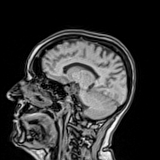
[im 81/126]
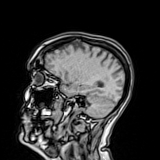
[im 90/126]
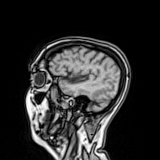
[im 99/126]
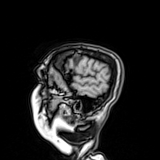
[im 108/126]
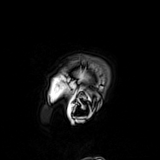
[im 117/126]
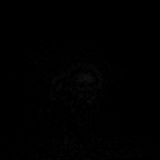
[im 126/126]
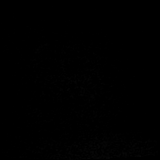

[Series 2: survey_mpr_sag · sagittal · 1.6mm · 1.60mm/px · 1 of 5 slices shown]
[im 1/5]
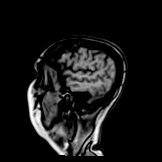

[Series 3: survey_mpr_cor · coronal · 1.6mm · 1.60mm/px · 1 of 3 slices shown]
[im 1/3]
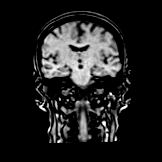

[Series 4: survey_mpr_(person_name) · axial · 1.6mm · 1.60mm/px · 1 of 3 slices shown]
[im 1/3]
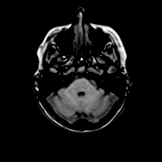

[Series 5: T1 · sagittal · 4.0mm · 0.72mm/px · 2 of 25 slices shown (1 of 2)]
[im 1/25]
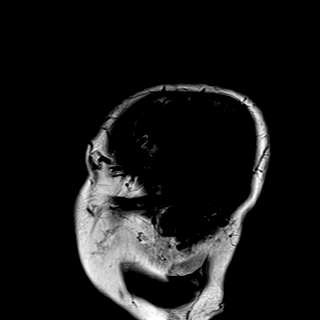
[im 25/25]
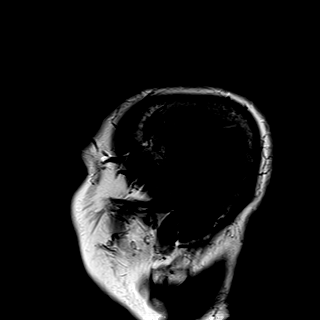

[Series 6: ax dwi_tracew · axial · 4.0mm · 0.60mm/px · z∈[-57,+90]mm · 4 of 32 slices shown]
[im 1/32]
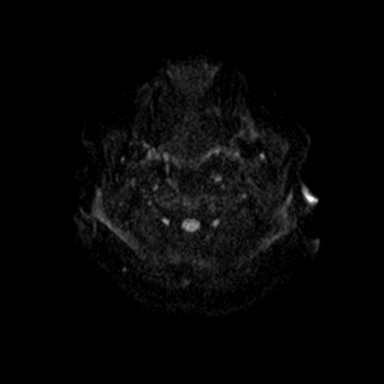
[im 11/32]
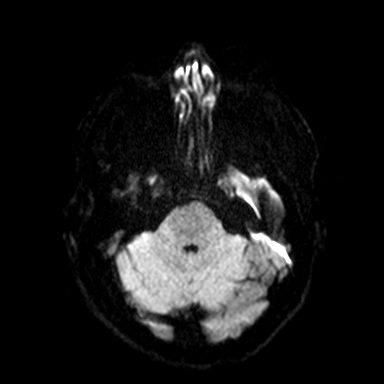
[im 21/32]
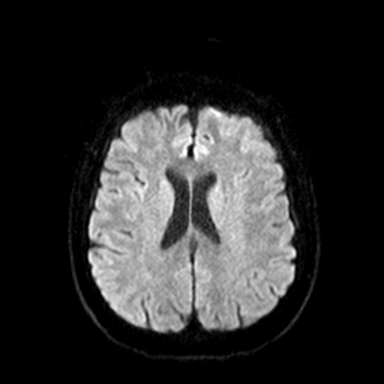
[im 32/32]
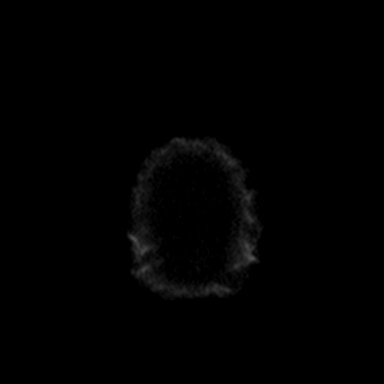

[Series 7: ax dwi_adc · axial · 4.0mm · 0.60mm/px · z∈[-57,+90]mm · 4 of 32 slices shown]
[im 1/32]
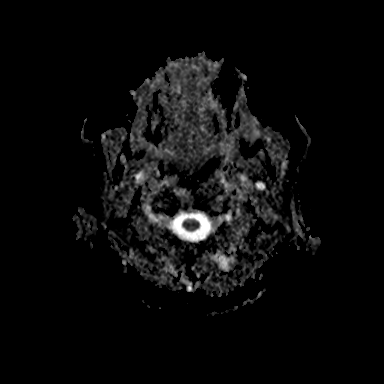
[im 11/32]
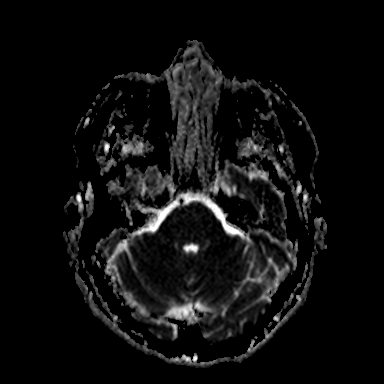
[im 21/32]
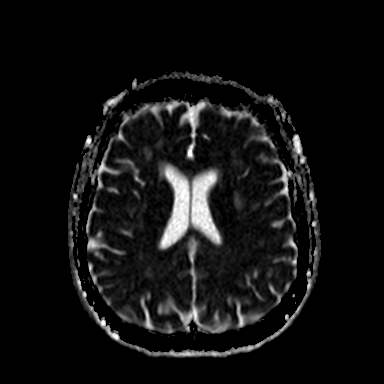
[im 32/32]
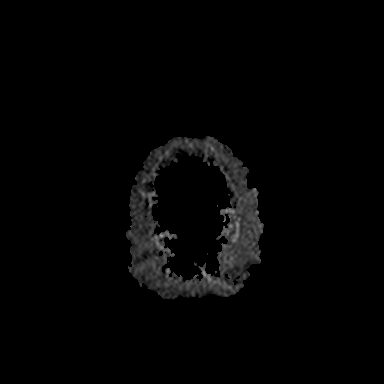

[Series 8: ax dwi_exp · axial · 4.0mm · 0.60mm/px · z∈[-57,+90]mm · 4 of 32 slices shown]
[im 1/32]
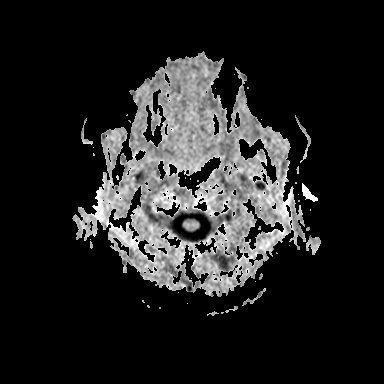
[im 11/32]
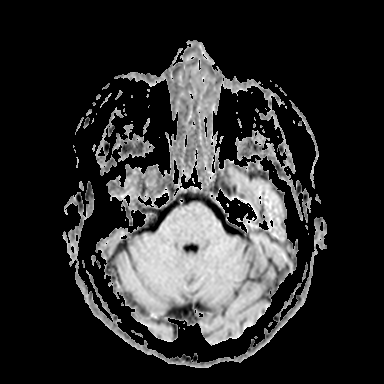
[im 21/32]
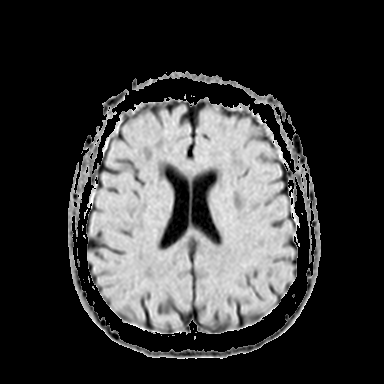
[im 32/32]
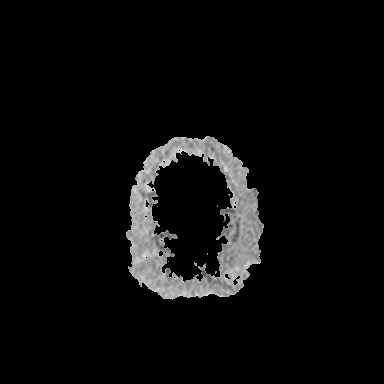

[Series 9: T2 · axial · 4.0mm · 0.51mm/px · z∈[-57,+90]mm · 4 of 32 slices shown]
[im 1/32]
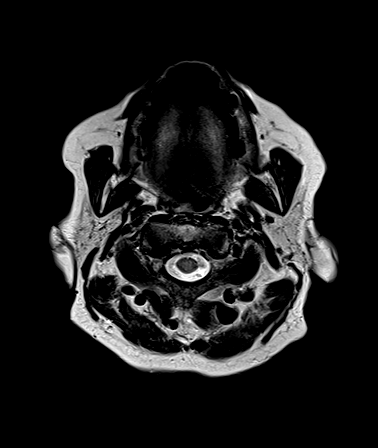
[im 11/32]
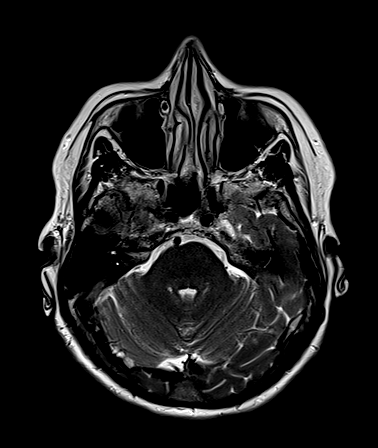
[im 21/32]
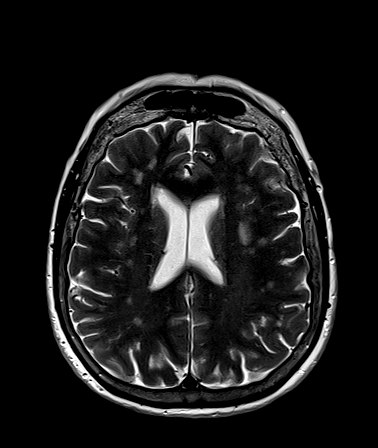
[im 32/32]
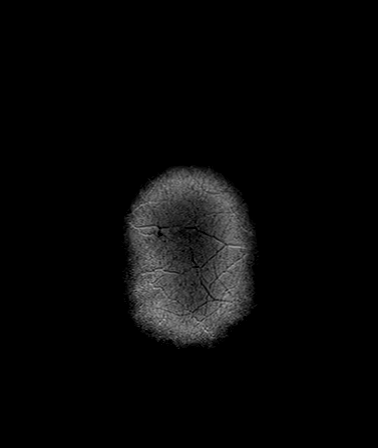

[Series 10: T1 · axial · 4.0mm · 0.72mm/px · z∈[-57,+90]mm · 4 of 32 slices shown (2 of 2)]
[im 1/32]
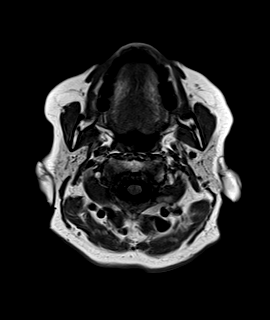
[im 11/32]
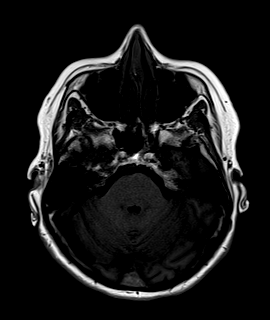
[im 21/32]
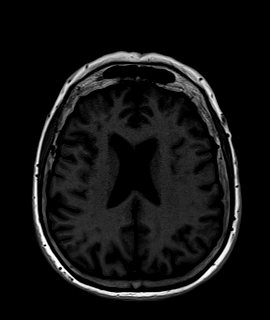
[im 32/32]
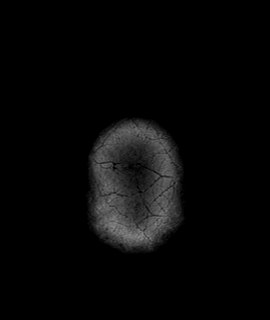

[Series 11: FLAIR · axial · 4.0mm · 0.72mm/px · z∈[-57,+90]mm · 4 of 32 slices shown]
[im 1/32]
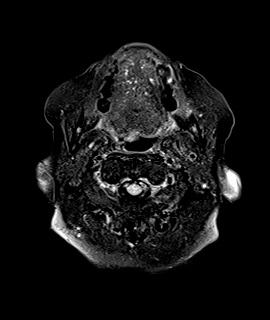
[im 11/32]
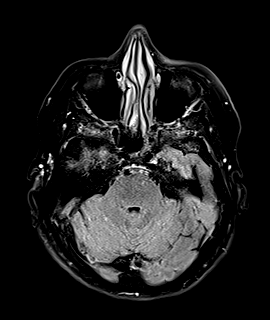
[im 21/32]
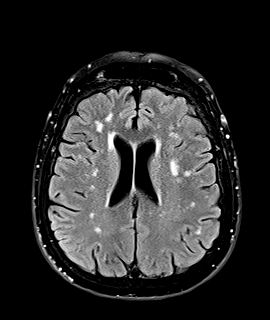
[im 32/32]
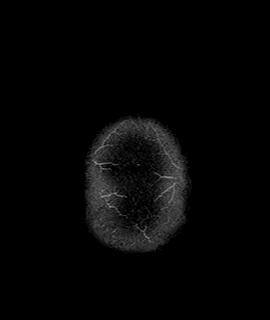

[Series 12: GRE · axial · 4.0mm · 0.45mm/px · z∈[-57,+90]mm · 4 of 32 slices shown]
[im 1/32]
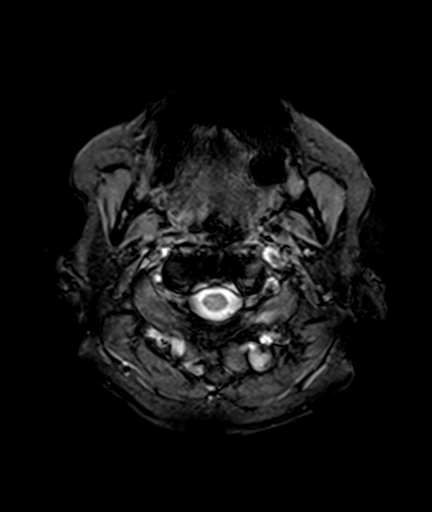
[im 11/32]
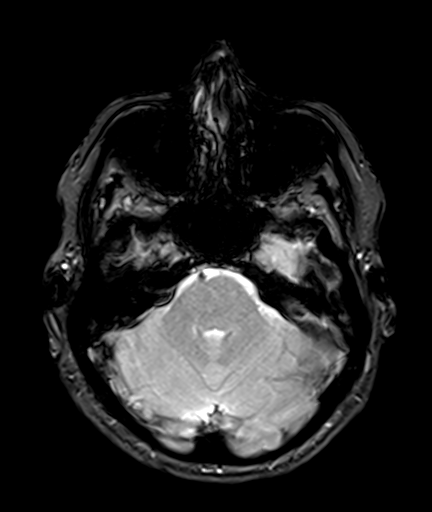
[im 21/32]
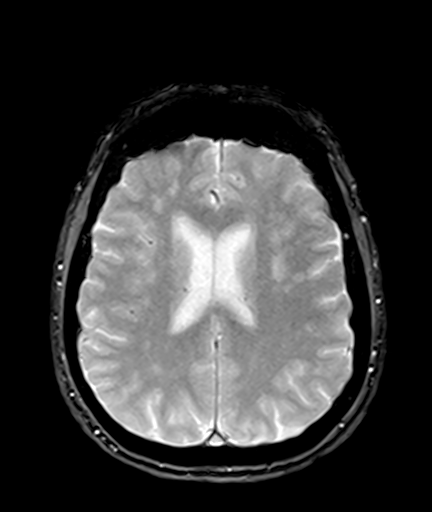
[im 32/32]
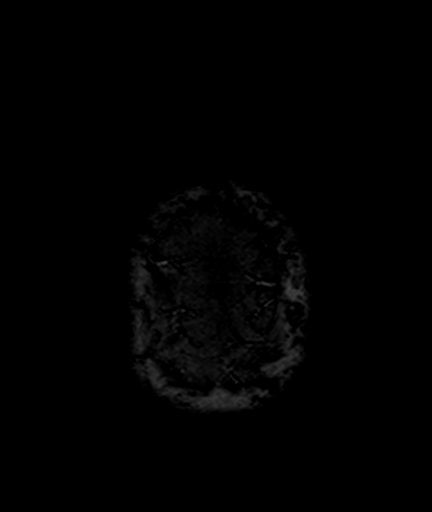

[48 of 48 positions shown; findings below may reference images not displayed]

FINDINGS: No midline shift. Basal cisterns are patent.  Ventricles are normal and symmetric.
No evidence for acute infarct, intracranial hemorrhage or mass lesion.  No extra-axial fluid collections. T2/FLAIR hyperintensities in the subcortical and periventricular white matter, nonspecific but most compatible with mild chronic microangiopathic ischemic changes.
Intracranial vascular flow voids are normal.
Orbital contents are unremarkable.
Sinuses and mastoids are clear.
IMPRESSION: No acute intracranial abnormality.

## 2024-02-04 IMAGING — CT CT SOFT TISSUE NECK WITH CONTRAST
3 of 4 series · 16 of 33 positions shown, 19 images · IV contrast (agent unspecified)
Comparison: none

Pain around her ear radiating down her neck
FINAL REPORT:
CT of the soft tissues of the neck with contrast
HISTORY: right mastoid pain
TECHNIQUE: Images were obtained through the soft tissues of the neck after the administration of IV contrast. Informed consent obtained prior to injection of IV contrast. All CT scans at this facility use iterative reconstruction technique, dose modulation and/or weight based dosing when appropriate to reduce radiation dose to as low as reasonably achievable.

[Series 3: thins · axial · 0.53mm/px · z∈[-226,+24]mm · 8 of 500 slices shown, 10 images]
[im 50/500  soft-tissue]
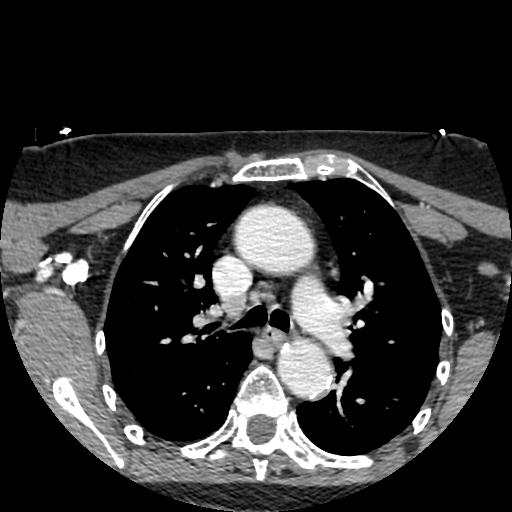
[im 50/500  bone]
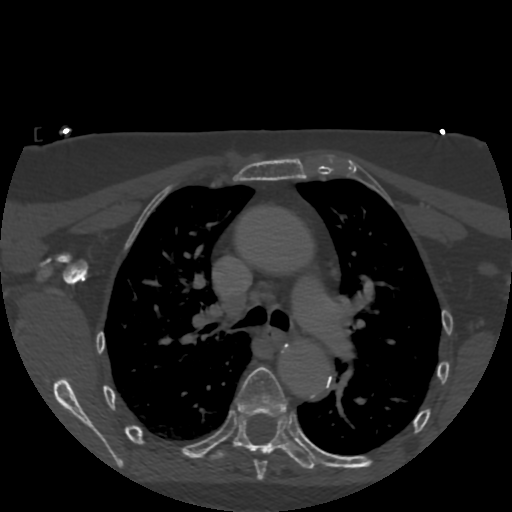
[im 100/500  bone]
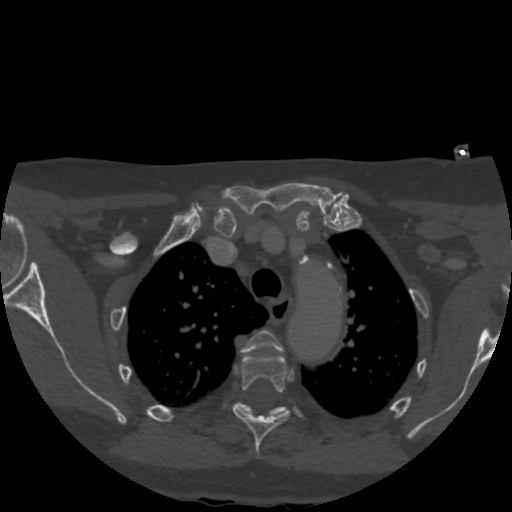
[im 150/500  bone]
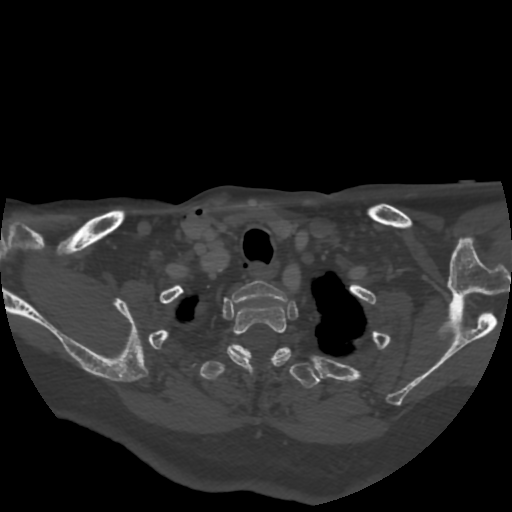
[im 200/500  bone]
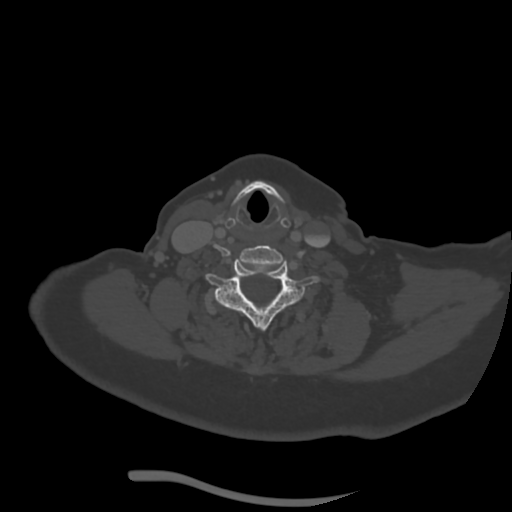
[im 300/500  soft-tissue]
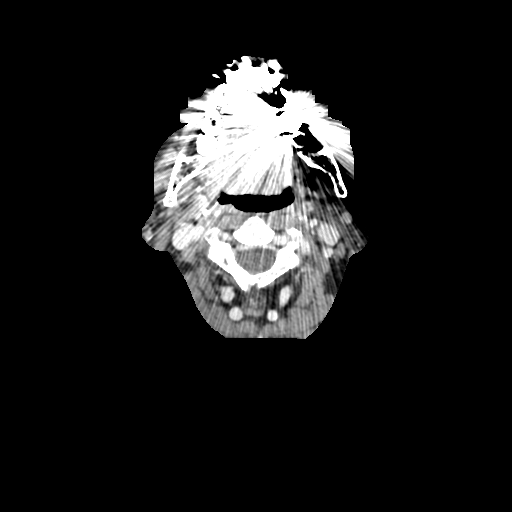
[im 300/500  bone]
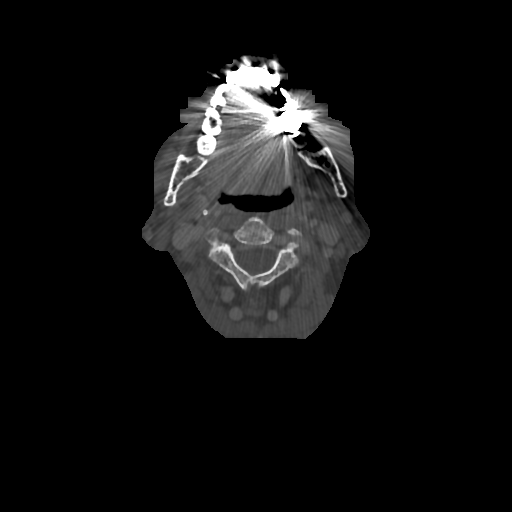
[im 350/500  bone]
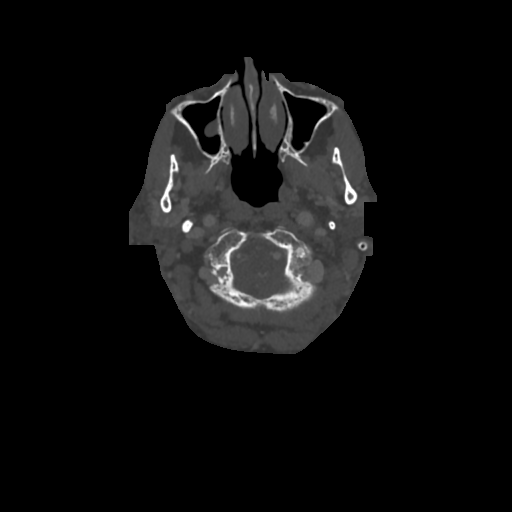
[im 400/500  bone]
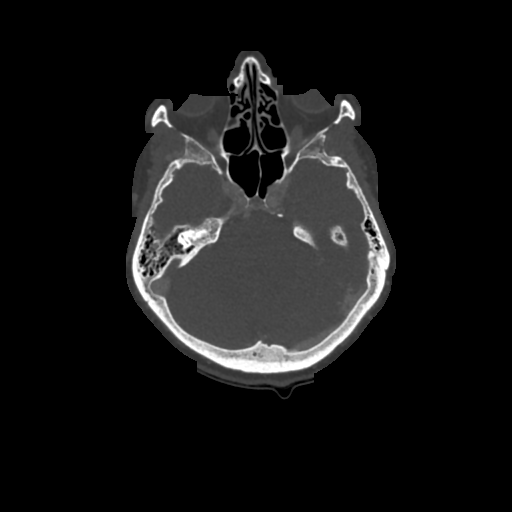
[im 450/500  bone]
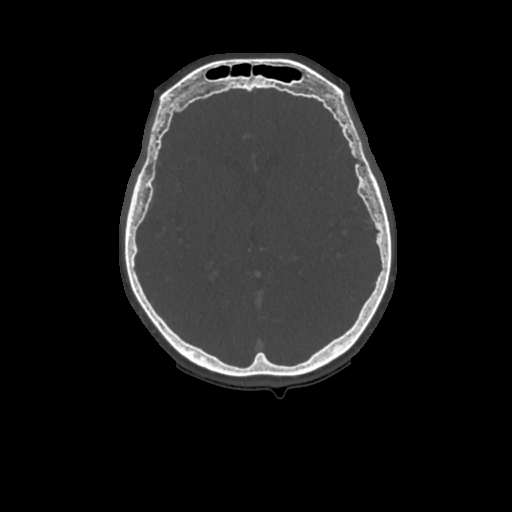

[Series 601: cor standard 2x2 · coronal · 0.61mm/px · 5 of 132 slices shown, 6 images]
[im 44/132  bone]
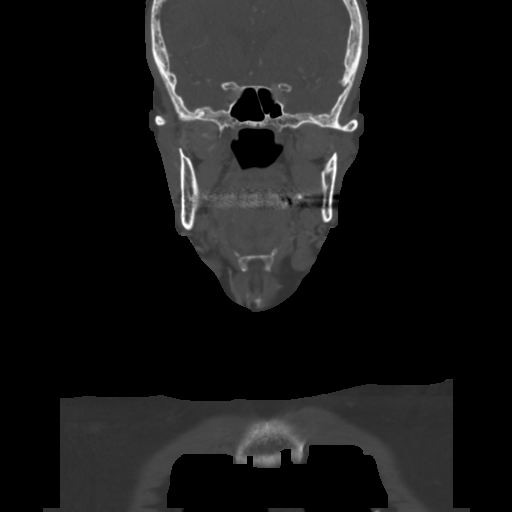
[im 55/132  bone]
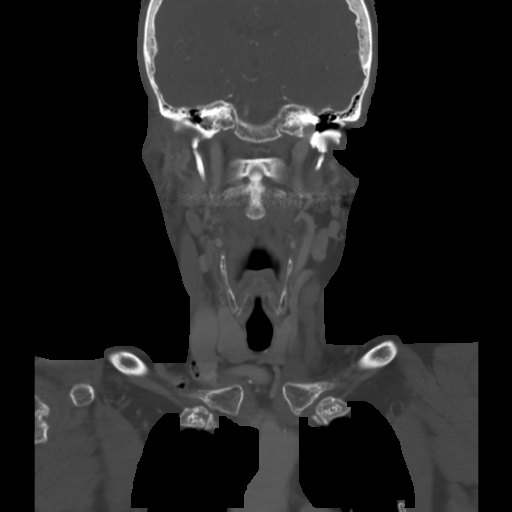
[im 66/132  soft-tissue]
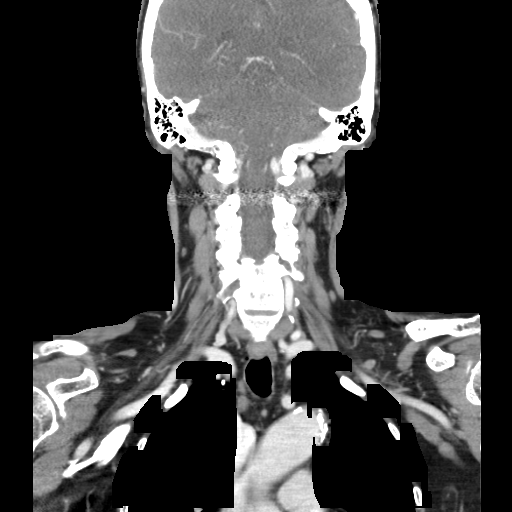
[im 66/132  bone]
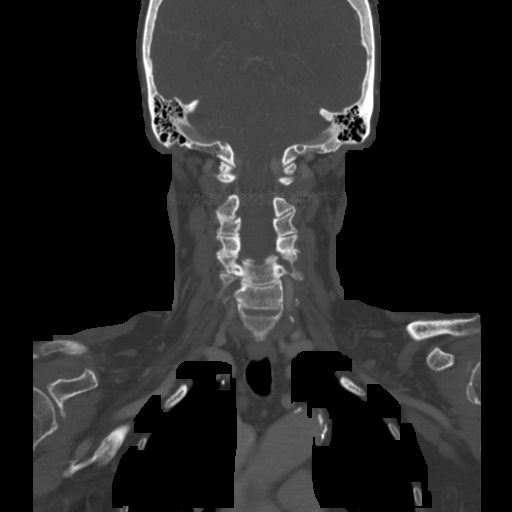
[im 77/132  bone]
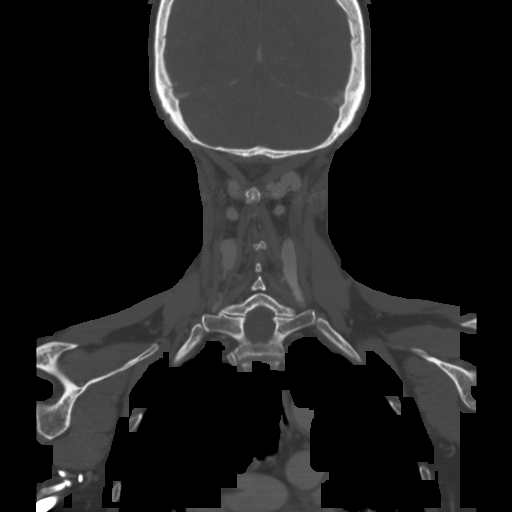
[im 88/132  bone]
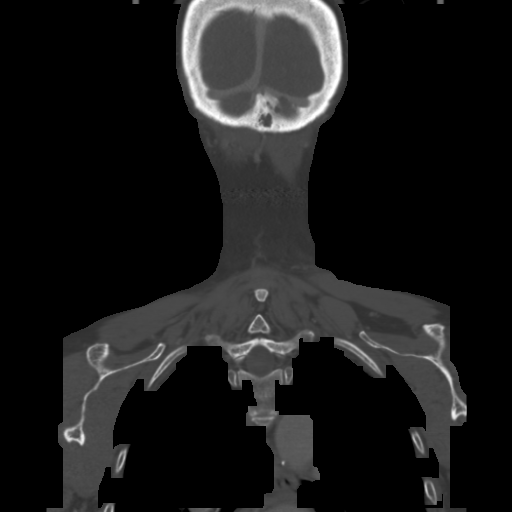

[Series 602: sag standard 2x2 · sagittal · 0.61mm/px · 3 of 138 slices shown]
[im 28/138  bone]
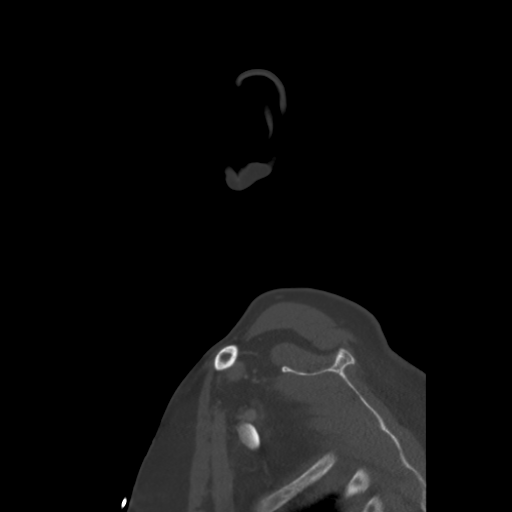
[im 55/138  bone]
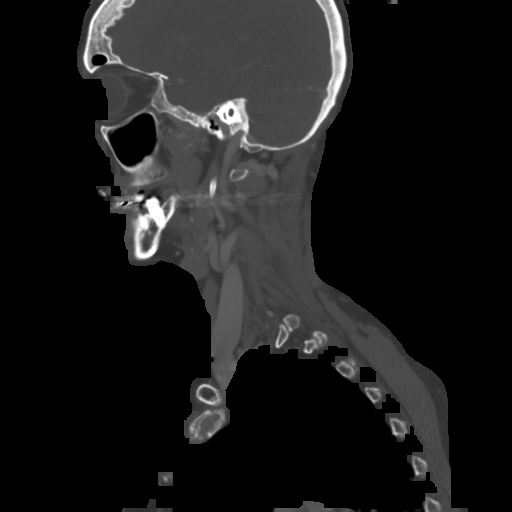
[im 83/138  bone]
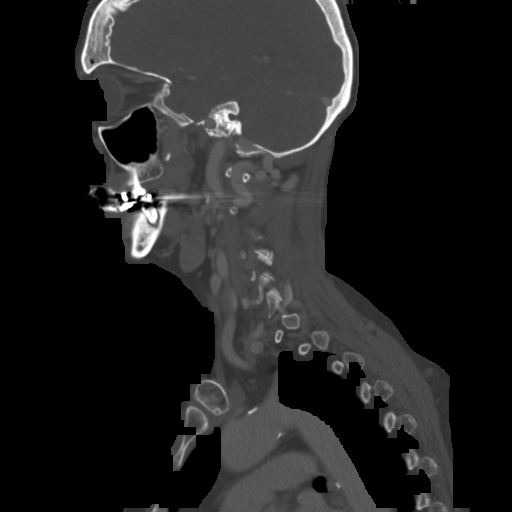

[16 of 33 positions shown; findings below may reference images not displayed]

FINDINGS: Nasopharynx, oropharynx, oral cavity, and larynx are clear. Epiglottis normal. The paranasal sinuses and mastoid air cells are clear. There is a periapical lucency at the left mandibular molar.
The parotid and submandibular glands are symmetrical in appearance and unremarkable. The thyroid gland is unremarkable. No suspicious adenopathy in the neck. No aggressive osseous lesions.
There is a 5 mm noncalcified nodule in the right upper lobe on image [DATE].
IMPRESSION: 1. No acute findings in the neck.
2. 5 mm noncalcified nodule in the right upper lobe. If there is a clinically significant history of smoking or cancer, a CT of the chest without contrast can be performed in 12 months if follow-up is desired. Otherwise no further imaging follow-up is required per 8052 Fleischner guidelines.
3. Periapical lucency at the left mandibular molar suggestive of a periapical granuloma from chronic infection. Correlate with dental exam.
